# Patient Record
Sex: Male | Born: 1950 | Race: White | Hispanic: No | Marital: Single | Smoking: Current some day smoker
Health system: Southern US, Community
[De-identification: ages and names within clinical notes are randomized; demographics above are authoritative.]

## PROBLEM LIST (undated history)

## (undated) DIAGNOSIS — K219 Gastro-esophageal reflux disease without esophagitis: Secondary | ICD-10-CM

## (undated) DIAGNOSIS — G9340 Encephalopathy, unspecified: Secondary | ICD-10-CM

## (undated) DIAGNOSIS — F1010 Alcohol abuse, uncomplicated: Secondary | ICD-10-CM

## (undated) DIAGNOSIS — I4891 Unspecified atrial fibrillation: Secondary | ICD-10-CM

## (undated) DIAGNOSIS — J449 Chronic obstructive pulmonary disease, unspecified: Secondary | ICD-10-CM

## (undated) DIAGNOSIS — Z720 Tobacco use: Secondary | ICD-10-CM

## (undated) DIAGNOSIS — J9600 Acute respiratory failure, unspecified whether with hypoxia or hypercapnia: Secondary | ICD-10-CM

## (undated) DIAGNOSIS — E785 Hyperlipidemia, unspecified: Secondary | ICD-10-CM

## (undated) DIAGNOSIS — I10 Essential (primary) hypertension: Secondary | ICD-10-CM

## (undated) HISTORY — DX: Unspecified atrial fibrillation: I48.91

## (undated) HISTORY — DX: Gastro-esophageal reflux disease without esophagitis: K21.9

## (undated) HISTORY — DX: Tobacco use: Z72.0

## (undated) HISTORY — DX: Alcohol abuse, uncomplicated: F10.10

## (undated) HISTORY — DX: Encephalopathy, unspecified: G93.40

## (undated) HISTORY — DX: Hyperlipidemia, unspecified: E78.5

## (undated) HISTORY — DX: Chronic obstructive pulmonary disease, unspecified: J44.9

## (undated) HISTORY — DX: Acute respiratory failure, unspecified whether with hypoxia or hypercapnia: J96.00

## (undated) HISTORY — DX: Essential (primary) hypertension: I10

## (undated) HISTORY — PX: CHOLECYSTECTOMY: SHX55

---

## 2005-06-21 ENCOUNTER — Emergency Department: Payer: Self-pay | Admitting: Emergency Medicine

## 2006-02-19 ENCOUNTER — Emergency Department: Payer: Self-pay | Admitting: Emergency Medicine

## 2009-05-29 ENCOUNTER — Ambulatory Visit: Payer: Self-pay | Admitting: Nurse Practitioner

## 2009-12-09 ENCOUNTER — Ambulatory Visit: Payer: Self-pay | Admitting: Oncology

## 2009-12-27 ENCOUNTER — Ambulatory Visit: Payer: Self-pay | Admitting: Oncology

## 2010-01-09 ENCOUNTER — Ambulatory Visit: Payer: Self-pay | Admitting: Oncology

## 2010-02-06 ENCOUNTER — Ambulatory Visit: Payer: Self-pay | Admitting: Oncology

## 2010-05-31 ENCOUNTER — Emergency Department: Payer: Self-pay | Admitting: Emergency Medicine

## 2011-01-30 ENCOUNTER — Ambulatory Visit: Payer: Self-pay

## 2011-11-29 ENCOUNTER — Inpatient Hospital Stay (HOSPITAL_COMMUNITY)
Admission: AD | Admit: 2011-11-29 | Discharge: 2011-12-02 | DRG: 208 | Disposition: A | Discharge status: Home / Self Care | Payer: Medicaid Other | Source: Other Acute Inpatient Hospital | Attending: Pulmonary Disease | Admitting: Pulmonary Disease

## 2011-11-29 ENCOUNTER — Inpatient Hospital Stay: Payer: Self-pay | Admitting: Internal Medicine

## 2011-11-29 DIAGNOSIS — J189 Pneumonia, unspecified organism: Secondary | ICD-10-CM | POA: Diagnosis present

## 2011-11-29 DIAGNOSIS — F17200 Nicotine dependence, unspecified, uncomplicated: Secondary | ICD-10-CM | POA: Diagnosis present

## 2011-11-29 DIAGNOSIS — G9340 Encephalopathy, unspecified: Secondary | ICD-10-CM

## 2011-11-29 DIAGNOSIS — J441 Chronic obstructive pulmonary disease with (acute) exacerbation: Secondary | ICD-10-CM | POA: Diagnosis present

## 2011-11-29 DIAGNOSIS — J9620 Acute and chronic respiratory failure, unspecified whether with hypoxia or hypercapnia: Principal | ICD-10-CM | POA: Diagnosis present

## 2011-11-29 DIAGNOSIS — G9341 Metabolic encephalopathy: Secondary | ICD-10-CM | POA: Diagnosis present

## 2011-11-29 DIAGNOSIS — J9600 Acute respiratory failure, unspecified whether with hypoxia or hypercapnia: Secondary | ICD-10-CM

## 2011-11-29 DIAGNOSIS — J159 Unspecified bacterial pneumonia: Secondary | ICD-10-CM

## 2011-11-29 LAB — COMPREHENSIVE METABOLIC PANEL
Albumin: 2.9 g/dL — ABNORMAL LOW (ref 3.5–5.2)
BUN: 15 mg/dL (ref 6–23)
Calcium: 8.3 mg/dL — ABNORMAL LOW (ref 8.4–10.5)
Creatinine, Ser: 0.61 mg/dL (ref 0.50–1.35)
Total Bilirubin: 0.3 mg/dL (ref 0.3–1.2)
Total Protein: 5.7 g/dL — ABNORMAL LOW (ref 6.0–8.3)

## 2011-11-29 LAB — MAGNESIUM: Magnesium: 1.8 mg/dL (ref 1.5–2.5)

## 2011-11-29 LAB — PHOSPHORUS: Phosphorus: 2.9 mg/dL (ref 2.3–4.6)

## 2011-11-29 LAB — POCT I-STAT 3, ART BLOOD GAS (G3+)
pCO2 arterial: 42.9 mmHg (ref 35.0–45.0)
pO2, Arterial: 129 mmHg — ABNORMAL HIGH (ref 80.0–100.0)

## 2011-11-29 LAB — URINE MICROSCOPIC-ADD ON

## 2011-11-29 LAB — STREP PNEUMONIAE URINARY ANTIGEN: Strep Pneumo Urinary Antigen: NEGATIVE

## 2011-11-29 LAB — GLUCOSE, CAPILLARY: Glucose-Capillary: 96 mg/dL (ref 70–99)

## 2011-11-29 LAB — PROTIME-INR: INR: 1.1 (ref 0.00–1.49)

## 2011-11-29 LAB — URINALYSIS, ROUTINE W REFLEX MICROSCOPIC
Glucose, UA: NEGATIVE mg/dL
Protein, ur: 30 mg/dL — AB

## 2011-11-29 LAB — MRSA PCR SCREENING: MRSA by PCR: NEGATIVE

## 2011-11-29 LAB — APTT: aPTT: 51 seconds — ABNORMAL HIGH (ref 24–37)

## 2011-11-29 LAB — PRO B NATRIURETIC PEPTIDE: Pro B Natriuretic peptide (BNP): 194.5 pg/mL — ABNORMAL HIGH (ref 0–125)

## 2011-11-29 MED ORDER — BIOTENE DRY MOUTH MT LIQD
1.0000 "application " | Freq: Four times a day (QID) | OROMUCOSAL | Status: DC
  Administered 2011-11-30 (×3): 15 mL via OROMUCOSAL

## 2011-11-29 MED ORDER — SODIUM CHLORIDE 0.9 % IV SOLN
INTRAVENOUS | Status: DC
  Administered 2011-11-29 – 2011-11-30 (×3): via INTRAVENOUS

## 2011-11-29 MED ORDER — FENTANYL CITRATE 0.05 MG/ML IJ SOLN
50.0000 ug | INTRAMUSCULAR | Status: DC | PRN
  Administered 2011-11-29: 100 ug via INTRAVENOUS
  Administered 2011-11-30 (×4): 50 ug via INTRAVENOUS
  Filled 2011-11-29 (×5): qty 2

## 2011-11-29 MED ORDER — IPRATROPIUM BROMIDE 0.02 % IN SOLN
0.5000 mg | Freq: Four times a day (QID) | RESPIRATORY_TRACT | Status: DC
  Administered 2011-11-29 – 2011-11-30 (×2): 0.5 mg via RESPIRATORY_TRACT
  Filled 2011-11-29 (×2): qty 2.5

## 2011-11-29 MED ORDER — DEXTROSE 5 % IV SOLN
2.0000 g | INTRAVENOUS | Status: DC
  Administered 2011-11-29 – 2011-11-30 (×2): 2 g via INTRAVENOUS
  Filled 2011-11-29 (×3): qty 2

## 2011-11-29 MED ORDER — METHYLPREDNISOLONE SODIUM SUCC 125 MG IJ SOLR: 80.0000 mg | Freq: Two times a day (BID) | INTRAMUSCULAR | Status: DC

## 2011-11-29 MED ORDER — MIDAZOLAM HCL 2 MG/2ML IJ SOLN: 2.0000 mg | INTRAMUSCULAR | Status: DC | PRN

## 2011-11-29 MED ORDER — DEXTROSE 5 % IV SOLN
1.0000 g | INTRAVENOUS | Status: DC
  Filled 2011-11-29: qty 10

## 2011-11-29 MED ORDER — SODIUM CHLORIDE 0.9 % IJ SOLN
INTRAMUSCULAR | Status: AC
  Administered 2011-11-29: 10 mL
  Filled 2011-11-29: qty 40

## 2011-11-29 MED ORDER — MOXIFLOXACIN HCL IN NACL 400 MG/250ML IV SOLN
400.0000 mg | INTRAVENOUS | Status: DC
  Administered 2011-11-29 – 2011-11-30 (×2): 400 mg via INTRAVENOUS
  Filled 2011-11-29 (×3): qty 250

## 2011-11-29 MED ORDER — IPRATROPIUM-ALBUTEROL 18-103 MCG/ACT IN AERO: 6.0000 | INHALATION_SPRAY | RESPIRATORY_TRACT | Status: DC | PRN

## 2011-11-29 MED ORDER — HEPARIN SODIUM (PORCINE) 5000 UNIT/ML IJ SOLN
5000.0000 [IU] | Freq: Three times a day (TID) | INTRAMUSCULAR | Status: DC
  Administered 2011-11-29 – 2011-12-01 (×7): 5000 [IU] via SUBCUTANEOUS
  Filled 2011-11-29 (×11): qty 1

## 2011-11-29 MED ORDER — ALBUTEROL SULFATE (5 MG/ML) 0.5% IN NEBU
2.5000 mg | INHALATION_SOLUTION | RESPIRATORY_TRACT | Status: DC
  Administered 2011-11-29 – 2011-11-30 (×4): 2.5 mg via RESPIRATORY_TRACT
  Filled 2011-11-29 (×4): qty 0.5

## 2011-11-29 MED ORDER — PANTOPRAZOLE SODIUM 40 MG IV SOLR
40.0000 mg | Freq: Every day | INTRAVENOUS | Status: DC
  Administered 2011-11-29 – 2011-11-30 (×2): 40 mg via INTRAVENOUS
  Filled 2011-11-29 (×3): qty 40

## 2011-11-29 MED ORDER — METHYLPREDNISOLONE SODIUM SUCC 125 MG IJ SOLR
60.0000 mg | Freq: Four times a day (QID) | INTRAMUSCULAR | Status: DC
  Administered 2011-11-29: 60 mg via INTRAVENOUS
  Filled 2011-11-29 (×6): qty 0.96

## 2011-11-29 MED ORDER — CHLORHEXIDINE GLUCONATE 0.12 % MT SOLN
15.0000 mL | Freq: Two times a day (BID) | OROMUCOSAL | Status: DC
  Administered 2011-11-29 – 2011-11-30 (×4): 15 mL via OROMUCOSAL
  Filled 2011-11-29 (×3): qty 15

## 2011-11-29 MED ORDER — SODIUM CHLORIDE 0.9 % IV SOLN: 250.0000 mL | INTRAVENOUS | Status: DC | PRN

## 2011-11-29 MED ORDER — IPRATROPIUM-ALBUTEROL 18-103 MCG/ACT IN AERO: 6.0000 | INHALATION_SPRAY | RESPIRATORY_TRACT | Status: DC

## 2011-11-29 NOTE — H&P (Signed)
Name: Shaun Mitchell MRN: [id number] DOB: July 29, 1951    LOS: 0  PCCM ADMISSION NOTE  History of Present Illness: 60 yo WM with h/o COPD who presented to [place] [facility] on 12/21 with altered mental status and respiratory distress.  He reported cough productive of yellow sputum.  Intubated for respiratory failure.  Lines / Drains: 12/21  ETT 12/21  OGT 12/21  Foley  Cultures: 12/21  UC 12/21  BC  Antibiotics: 12/21  Ceftriaxone 12/21  Avelox  Tests / Events: 12/21  Intubated for respiratory failure  The patient is sedated, intubated and unable to provide history, which was obtained for available medical records.    No past medical history on file. No past surgical history on file. Prior to Admission medications   Not on File   Allergies Allergies not on file  Family History No family history on file.  Social History  does not have a smoking history on file. He does not have any smokeless tobacco history on file. His alcohol and drug histories not on file.  Review Of Systems  11 points review of systems is negative with an exception of listed in HPI.  Vital Signs: Pulse Rate:  [99] 99  (12/21 1700) Resp:  [17] 17  (12/21 1700) BP: (98)/(69) 98/69 mmHg (12/21 1700) SpO2:  [96 %] 96 % (12/21 1700) FiO2 (%):  [40 %] 40 % (12/21 1700) Weight:  [64 kg (141 lb 1.5 oz)] 141 lb 1.5 oz (64 kg) (12/21 1700)    Physical Examination: General:  Mechanically ventilated, synchronnous Neuro:  Sedated, no distress   HEENT:  ETT, NGT Neck:  No JVD   Cardiovascular:  RRR, no M/R/G Lungs:  Diminished bilateral air entry, few exp wheezes Abdomen:  Soft, nontender, bowel sounds present Musculoskeletal:  No edema Skin:  Intact  Ventilator settings: Vent Mode:  [-] PRVC FiO2 (%):  [40 %] 40 % Set Rate:  [16 bmp] 16 bmp Vt Set:  [500 mL] 500 mL PEEP:  [5.4 cmH20] 5.4 cmH20 Plateau Pressure:  [18 cmH20] 18 cmH20  Labs and Imaging:  Reviewed.  Please refer to the  Assessment and Plan section for relevant results.  Assessment and Plan:  COPD, acute exacerbation -->bronchodilators -->Solu-Medrol 60 mg IV q6h  Suspected community acquired pneumonia -->flu screen -->blood / sputum Cx -->Strep/Legion U Ag -->PCT -->ceftriaxone / Avelox  Acute respiratory failure -->full mechanical support -->goal pH > 7.30, goal SpO2 > 92 -->f/u ABG -->f/u CXR -->daily SBT  Metabolic encphalopathy -->Fentanyl/Versed for ventilator synchrony -->daily WUA  Best practices / Disposition -->ICU status under PCCM -->full code -->Heparin for DVT Px -->Protonix for GI Px -->ventilator bundle -->NPO -->family is not available for update  The patient is critically ill with multiple organ systems failure and requires high complexity decision making for assessment and support, frequent evaluation and titration of therapies, application of advanced monitoring technologies and extensive interpretation of multiple databases. Critical Care Time devoted to patient care services described in this note is 35 minutes.  Orlean Bradford, M.D. Pulmonary and Critical Care Medicine [facility] Cell: [phone] Pager: [phone]  11/29/2011, 5:34 PM

## 2011-11-29 NOTE — Progress Notes (Signed)
eLink Physician-Brief Progress Note Patient Name: Shaun Mitchell DOB: Sep 17, 1951 MRN: [id number]  Date of Service  11/29/2011   HPI/Events of Note   Pt admitted in tfr from [place] for Copd exacerbation and VDRF  eICU Interventions  See admission orders, full note to follow   Intervention Category Major Interventions: Respiratory failure - evaluation and management  [name] 11/29/2011, 5:33 PM

## 2011-11-30 ENCOUNTER — Inpatient Hospital Stay (HOSPITAL_COMMUNITY): Payer: Medicaid Other

## 2011-11-30 LAB — CARDIAC PANEL(CRET KIN+CKTOT+MB+TROPI)
CK, MB: 3.5 ng/mL (ref 0.3–4.0)
CK, MB: 3.6 ng/mL (ref 0.3–4.0)
CK, MB: 4.7 ng/mL — ABNORMAL HIGH (ref 0.3–4.0)
Total CK: 57 U/L (ref 7–232)
Troponin I: <0.3 ng/mL (ref <0.30)
Troponin I: <0.3 ng/mL (ref <0.30)
Troponin I: <0.3 ng/mL (ref <0.30)

## 2011-11-30 LAB — POCT I-STAT 3, ART BLOOD GAS (G3+)
O2 Saturation: 97 %
Patient temperature: 98.6
TCO2: 28 mmol/L (ref 0–100)
pCO2 arterial: 47.2 mmHg — ABNORMAL HIGH (ref 35.0–45.0)
pO2, Arterial: 93 mmHg (ref 80.0–100.0)

## 2011-11-30 LAB — INFLUENZA PANEL BY PCR (TYPE A & B): H1N1 flu by pcr: NOT DETECTED

## 2011-11-30 LAB — MAGNESIUM: Magnesium: 2 mg/dL (ref 1.5–2.5)

## 2011-11-30 LAB — PHOSPHORUS: Phosphorus: 1.7 mg/dL — ABNORMAL LOW (ref 2.3–4.6)

## 2011-11-30 MED ORDER — SODIUM CHLORIDE 0.9 % IJ SOLN
INTRAMUSCULAR | Status: AC
  Administered 2011-11-30: 10 mL
  Filled 2011-11-30: qty 20

## 2011-11-30 MED ORDER — IPRATROPIUM BROMIDE 0.02 % IN SOLN: 0.5000 mg | Freq: Four times a day (QID) | RESPIRATORY_TRACT | Status: DC

## 2011-11-30 MED ORDER — IPRATROPIUM BROMIDE 0.02 % IN SOLN
0.5000 mg | Freq: Four times a day (QID) | RESPIRATORY_TRACT | Status: DC
  Administered 2011-11-30 – 2011-12-01 (×4): 0.5 mg via RESPIRATORY_TRACT
  Filled 2011-11-30 (×4): qty 2.5

## 2011-11-30 MED ORDER — ALBUTEROL SULFATE (5 MG/ML) 0.5% IN NEBU
2.5000 mg | INHALATION_SOLUTION | Freq: Four times a day (QID) | RESPIRATORY_TRACT | Status: DC
  Administered 2011-11-30 – 2011-12-01 (×4): 2.5 mg via RESPIRATORY_TRACT
  Filled 2011-11-30 (×4): qty 0.5

## 2011-11-30 MED ORDER — METOPROLOL TARTRATE 50 MG PO TABS
50.0000 mg | ORAL_TABLET | Freq: Two times a day (BID) | ORAL | Status: DC
  Administered 2011-11-30 – 2011-12-02 (×4): 50 mg via ORAL
  Filled 2011-11-30 (×6): qty 1

## 2011-11-30 MED ORDER — METHYLPREDNISOLONE SODIUM SUCC 40 MG IJ SOLR
40.0000 mg | Freq: Three times a day (TID) | INTRAMUSCULAR | Status: DC
  Administered 2011-11-30 – 2011-12-01 (×3): 40 mg via INTRAVENOUS
  Filled 2011-11-30 (×6): qty 1

## 2011-11-30 NOTE — Progress Notes (Signed)
CRITICAL VALUE ALERT  Critical value received:  CXRAY shows questionable right-sided pneumonia per Radiologist.  Date of notification:  11/30/11  Time of notification:  00:48  Critical value read back:yes  Nurse who received alert:  Levander Campion RN BSN  MD notified (1st page):  Ilda Basset, MD  Time of first page:  00:48  MD notified (2nd page):  Time of second page:  Responding MD:  Ilda Basset, MD  Time MD responded:  00:59

## 2011-11-30 NOTE — Procedures (Signed)
Extubation Procedure Note  Patient Details:   Name: Shaun Mitchell DOB: 10/19/1951 MRN: [id number]   Airway Documentation:  Airway 7.5 mm (Active)  Secured at (cm) 23 cm 11/30/2011  9:32 AM  Measured From Lips 11/30/2011  9:32 AM  Secured Location Right 11/30/2011  9:32 AM  Secured By [employer] 11/30/2011  9:32 AM  Tube Holder Repositioned Yes 11/30/2011  8:18 AM    Evaluation  O2 sats: stable throughout Complications: No apparent complications Patient did tolerate procedure well. Bilateral Breath Sounds: Diminished Suctioning: Airway Yes  Joylene John 11/30/2011, 10:59 AM

## 2011-11-30 NOTE — Progress Notes (Signed)
Name: Shaun Mitchell MRN: [id number] DOB: [birth year]  DOS: 11/30/2011    LOS: 1  CRITICAL CARE PROGRESS NOTE  Patient description: 60 yo WM with h/o COPD who presented to [place] [facility] on 12/21 with altered mental status and respiratory distress. He reported cough productive of yellow sputum. Intubated for respiratory failure.  Lines / Drains:  12/21 ETT >>>12/22 12/21 OGT >>>12/22 12/21 Foley >>>12/22  Cultures:  12/21 UC >>> 12/21 BC>>>  12/21 urine strep>> NEG  Antibiotics:  12/21 Ceftriaxone >>> 12/21 Avelox >>>  Tests / Events:  12/21 Intubated for respiratory failure  Overnight: Much improved.  Awake and alert this am, comfortable on PS 5/5.  Extubated w/o difficulty.  Intake/Output: 12/21 0701 - 12/22 0700 In: 1310 [I.V.:1010; IV Piggyback:300] Out: 625 [Urine:625]   Ventilator  Settings: Vent Mode:  [-] CPAP;PSV FiO2 (%):  [39.7 %-41.9 %] 39.7 % Set Rate:  [16 bmp] 16 bmp Vt Set:  [500 mL] 500 mL PEEP:  [5 cmH20-5.7 cmH20] 5 cmH20 Pressure Support:  [5 cmH20] 5 cmH20 Plateau Pressure:  [16 cmH20-24 cmH20] 16 cmH20  Vital Signs: Temp:  [98.6 F (37 C)-99.2 F (37.3 C)] 98.9 F (37.2 C) (12/22 0802) Pulse Rate:  [83-107] 106  (12/22 0900) Resp:  [14-22] 19  (12/22 0900) BP: (79-133)/(57-77) 116/68 mmHg (12/22 0900) SpO2:  [96 %-100 %] 99 % (12/22 0900) FiO2 (%):  [39.7 %-41.9 %] 39.7 % (12/22 0900) Weight:  [141 lb 1.5 oz (64 kg)-145 lb 15.1 oz (66.2 kg)] 145 lb 15.1 oz (66.2 kg) (12/22 0500)  Physical Examination: General: wdwn male, NAD  Neuro: awake and alert, appropriate, follows all commands CV: s1s2 rrr, no m/r/g PULM: resps even non labored on PS 5/5, Vt =550, slight diminished L otherwise CTA GI: soft, +bs Extremities: warm and dry, no edema     Basic Metabolic Panel:  Lab 11/30/11 4098 11/29/11 1800  NA -- 132*  K -- 3.7  CL -- 96  CO2 -- 27  GLUCOSE -- 96  BUN -- 15  CREATININE -- 0.61  CALCIUM -- 8.3*  MG 2.0 1.8  PHOS  1.7* 2.9   Liver Function Tests:  Lab 11/29/11 1800  AST 108*  ALT 138*  ALKPHOS 44  BILITOT 0.3  PROT 5.7*  ALBUMIN 2.9*    Cardiac Enzymes:  Lab 11/30/11 0057  CKTOTAL 57  CKMB 3.5  CKMBINDEX --  TROPONINI <0.30     Imaging: Dg Chest Port 1 View  11/30/2011  *RADIOLOGY REPORT*  Clinical Data: Endotracheal tube placement; respiratory failure.  PORTABLE CHEST - 1 VIEW  Comparison: None.  Findings: The patient's endotracheal tube is seen ending 4 cm above the carina.  An enteric tube is noted extending below the diaphragm.  The lungs are well-aerated.  Mild patchy right-sided airspace opacity raises question for mild pneumonia.  There is no evidence of pleural effusion or pneumothorax.  The cardiomediastinal silhouette is within normal limits.  No acute osseous abnormalities are seen.  IMPRESSION:  1.  Endotracheal tube seen ending 4 cm above the carina. 2.  Mild patchy right-sided airspace opacity raises question for mild pneumonia.  Original Report Authenticated By: Tonia Ghent, M.D.   Assessment/ Plan  1. Acute on chronic resp failure r/t AECOPD +/- CAP.  Looks great on PS wean.  PLAN -  -extubate  bronchodilators  Wean solumedrol - 40 q 8 F/u CXR pulm hygiene  2. Suspected community acquired pneumonia  -->flu screen pending -->blood / sputum Cx  -->Strep/Legion  U Ag  -->PCT  -->ceftriaxone / Avelox   3. Metabolic encphalopathy -- Resolved.      Danford Bad, NP 11/30/2011  10:26 AM  I have reviewed above, examined pt, and agree with assessment/plan.  Coralyn Helling, MD 11/30/2011, 12:35 PM Pager:  [phone]

## 2011-12-01 LAB — LEGIONELLA ANTIGEN, URINE

## 2011-12-01 MED ORDER — MOXIFLOXACIN HCL 400 MG PO TABS
400.0000 mg | ORAL_TABLET | Freq: Every day | ORAL | Status: DC
  Administered 2011-12-01: 400 mg via ORAL
  Filled 2011-12-01 (×2): qty 1

## 2011-12-01 MED ORDER — PANTOPRAZOLE SODIUM 40 MG PO TBEC
40.0000 mg | DELAYED_RELEASE_TABLET | Freq: Every day | ORAL | Status: DC
  Administered 2011-12-01 – 2011-12-02 (×2): 40 mg via ORAL
  Filled 2011-12-01 (×2): qty 1

## 2011-12-01 MED ORDER — BIOTENE DRY MOUTH MT LIQD
15.0000 mL | Freq: Two times a day (BID) | OROMUCOSAL | Status: DC
  Administered 2011-12-01: 15 mL via OROMUCOSAL

## 2011-12-01 MED ORDER — IPRATROPIUM BROMIDE 0.02 % IN SOLN
0.5000 mg | Freq: Three times a day (TID) | RESPIRATORY_TRACT | Status: DC
  Administered 2011-12-01 – 2011-12-02 (×3): 0.5 mg via RESPIRATORY_TRACT
  Filled 2011-12-01 (×3): qty 2.5

## 2011-12-01 MED ORDER — ALBUTEROL SULFATE (5 MG/ML) 0.5% IN NEBU
2.5000 mg | INHALATION_SOLUTION | Freq: Three times a day (TID) | RESPIRATORY_TRACT | Status: DC
  Administered 2011-12-01 – 2011-12-02 (×3): 2.5 mg via RESPIRATORY_TRACT
  Filled 2011-12-01 (×3): qty 0.5

## 2011-12-01 NOTE — Progress Notes (Signed)
Name: Shaun Mitchell MRN: [id number] DOB: [birth year]  DOS: 12/01/2011    LOS: 2  CRITICAL CARE PROGRESS NOTE  Patient description: 60 yo WM with h/o COPD who presented to [place] [facility] on 12/21 with altered mental status and respiratory distress. He reported cough productive of yellow sputum. Intubated for respiratory failure.  Lines / Drains:  12/21 ETT >>>12/22 12/21 OGT >>>12/22 12/21 Foley >>>12/22  Cultures:  12/21 UC >>> 12/21 BC>>>  12/21 urine strep>> NEG 12/22 sputum>>> rare yeast>>> 12/21 flu>>>NEG  Antibiotics:  12/21 Ceftriaxone >>>12/23 12/21 Avelox >>>  Tests / Events:  12/21 Intubated for respiratory failure  Overnight: Much improved.  Extubated 12/22.  Feels good.  Has not been OOB. Wants to walk.   Intake/Output: 12/22 0701 - 12/23 0700 In: 1305 [P.O.:480; I.V.:525; IV Piggyback:300] Out: 2100 [Urine:2100]   Vital Signs: Temp:  [97.9 F (36.6 C)-99.3 F (37.4 C)] 97.9 F (36.6 C) (12/23 0800) Pulse Rate:  [76-140] 76  (12/23 0700) Resp:  [19-27] 20  (12/23 0700) BP: (112-173)/(68-94) 122/69 mmHg (12/23 0700) SpO2:  [92 %-100 %] 97 % (12/23 0700) FiO2 (%):  [39.7 %-98 %] 98 % (12/23 0146) Weight:  [142 lb 3.2 oz (64.5 kg)] 142 lb 3.2 oz (64.5 kg) (12/23 0500)  Physical Examination: General: wdwn male, NAD  Neuro: awake and alert, appropriate, MAE CV: s1s2 rrr, no m/r/g PULM: resps even non labored on [place], slight diminished bases, no audible wheeze  GI: soft, +bs Extremities: warm and dry, no edema     Basic Metabolic Panel:  Lab 11/30/11 4098 11/29/11 1800  NA -- 132*  K -- 3.7  CL -- 96  CO2 -- 27  GLUCOSE -- 96  BUN -- 15  CREATININE -- 0.61  CALCIUM -- 8.3*  MG 2.0 1.8  PHOS 1.7* 2.9   Liver Function Tests:  Lab 11/29/11 1800  AST 108*  ALT 138*  ALKPHOS 44  BILITOT 0.3  PROT 5.7*  ALBUMIN 2.9*    Cardiac Enzymes:  Lab 11/30/11 1630 11/30/11 0959 11/30/11 0057  CKTOTAL 73 51 57  CKMB 4.7* 3.6 3.5  CKMBINDEX  -- -- --  TROPONINI <0.30 <0.30 <0.30    Imaging: Dg Chest Port 1 View  11/30/2011  *RADIOLOGY REPORT*  Clinical Data: Endotracheal tube placement; respiratory failure.  PORTABLE CHEST - 1 VIEW  Comparison: None.  Findings: The patient's endotracheal tube is seen ending 4 cm above the carina.  An enteric tube is noted extending below the diaphragm.  The lungs are well-aerated.  Mild patchy right-sided airspace opacity raises question for mild pneumonia.  There is no evidence of pleural effusion or pneumothorax.  The cardiomediastinal silhouette is within normal limits.  No acute osseous abnormalities are seen.  IMPRESSION:  1.  Endotracheal tube seen ending 4 cm above the carina. 2.  Mild patchy right-sided airspace opacity raises question for mild pneumonia.  Original Report Authenticated By: Tonia Ghent, M.D.   Assessment/ Plan  1. Acute on chronic resp failure r/t AECOPD +/- CAP.  Much improved. Extubated 12/22.  PLAN -  Cont BD Change steroids, abx to PO  pulm hygiene Mobilize Needs outpt pulm f/u -- ?? See Dr. Kendrick Fries in [place], pt thinks he has pulm referral scheduled  Anticipate d/c home in am   2. Suspected community acquired pneumonia  -->flu screen neg -->follow blood / sputum Cx  -->Strep/Legion U Ag  -->PCT neg -->narrow abx to PO avelox   3. Metabolic encephalopathy -- Resolved.  Danford Bad, NP 12/01/2011  8:10 AM  Reviewed above, examined patient, and agree with assessment/plan.    Nayeliz Hipp, MD 12/01/2011, 10:20 AM Pager:  [phone]

## 2011-12-02 DIAGNOSIS — J441 Chronic obstructive pulmonary disease with (acute) exacerbation: Secondary | ICD-10-CM | POA: Diagnosis present

## 2011-12-02 DIAGNOSIS — G9341 Metabolic encephalopathy: Secondary | ICD-10-CM | POA: Diagnosis present

## 2011-12-02 DIAGNOSIS — J189 Pneumonia, unspecified organism: Secondary | ICD-10-CM | POA: Diagnosis present

## 2011-12-02 DIAGNOSIS — J9620 Acute and chronic respiratory failure, unspecified whether with hypoxia or hypercapnia: Secondary | ICD-10-CM | POA: Diagnosis present

## 2011-12-02 LAB — CULTURE, RESPIRATORY W GRAM STAIN

## 2011-12-02 MED ORDER — IPRATROPIUM-ALBUTEROL 18-103 MCG/ACT IN AERO: 2.0000 | INHALATION_SPRAY | Freq: Four times a day (QID) | RESPIRATORY_TRACT | Status: DC

## 2011-12-02 MED ORDER — PREDNISONE 20 MG PO TABS
40.0000 mg | ORAL_TABLET | Freq: Every day | ORAL | Status: DC
  Administered 2011-12-02: 40 mg via ORAL
  Filled 2011-12-02 (×3): qty 2

## 2011-12-02 MED ORDER — GUAIFENESIN ER 600 MG PO TB12: 1200.0000 mg | ORAL_TABLET | Freq: Two times a day (BID) | ORAL | Status: AC

## 2011-12-02 MED ORDER — MOXIFLOXACIN HCL 400 MG PO TABS: 400.0000 mg | ORAL_TABLET | Freq: Every day | ORAL | Status: AC

## 2011-12-02 MED ORDER — ALBUTEROL SULFATE (5 MG/ML) 0.5% IN NEBU: 2.5000 mg | INHALATION_SOLUTION | Freq: Three times a day (TID) | RESPIRATORY_TRACT | Status: AC

## 2011-12-02 MED ORDER — IPRATROPIUM-ALBUTEROL 18-103 MCG/ACT IN AERO
2.0000 | INHALATION_SPRAY | Freq: Four times a day (QID) | RESPIRATORY_TRACT | Status: DC
  Administered 2011-12-02 (×2): 2 via RESPIRATORY_TRACT
  Filled 2011-12-02: qty 14.7

## 2011-12-02 MED ORDER — GUAIFENESIN ER 600 MG PO TB12
1200.0000 mg | ORAL_TABLET | Freq: Two times a day (BID) | ORAL | Status: DC
  Administered 2011-12-02: 1200 mg via ORAL
  Filled 2011-12-02 (×2): qty 2

## 2011-12-02 MED ORDER — PREDNISONE 10 MG PO TABS: ORAL_TABLET | ORAL | Status: DC

## 2011-12-02 MED ORDER — METOPROLOL TARTRATE 50 MG PO TABS: 50.0000 mg | ORAL_TABLET | Freq: Two times a day (BID) | ORAL | Status: DC

## 2011-12-02 MED ORDER — BUDESONIDE 0.25 MG/2ML IN SUSP: 0.2500 mg | Freq: Two times a day (BID) | RESPIRATORY_TRACT | Status: DC

## 2011-12-02 MED ORDER — IPRATROPIUM BROMIDE 0.02 % IN SOLN: 0.5000 mg | Freq: Three times a day (TID) | RESPIRATORY_TRACT | Status: DC

## 2011-12-02 NOTE — Discharge Summary (Signed)
Physician Discharge Summary  Patient ID: Shaun Mitchell MRN: [id number] DOB/AGE: January 22, 1951 60 y.o.  Admit date: 11/29/2011 Discharge date: 12/02/2011    Discharge Diagnoses:  Active Problems:  Acute-on-chronic respiratory failure  COPD with acute exacerbation  CAP (community acquired pneumonia)  Metabolic encephalopathy    Brief Summary: Shaun Mitchell is a 60 y.o. y/o male with a PMH of COPD who presented on December 21 [facility] with altered mental status and acute respiratory distress. Per [place] reports he had cough productive of yellow sputum and increased shortness of breath for a short time prior to admission. He was intubated for respiratory failure and transferred from  to [facility] on December 21.  Hospital Course by Discharge Summary  Acute-on-chronic respiratory failure -- multifactorial in the setting of acute exacerbation of COPD and community-acquired pneumonia. Patient required intubation and mechanical ventilation from 12/21 through 12/22. He was treated with IV antibiotics, nebulized bronchodilators, IV steroids. He improved quickly and was successfully extubated on 12/22. Antibiotics and steroids were transitioned to by mouth. At the time of discharge patient's respiratory status is back to his baseline. He is back to his baseline O2 requirement. He'll be discharged with continued prednisone taper, nebulized bronchodilators, home O2, by mouth antibiotics. He will followup with Dr. Welton Flakes, pulmonary in [place].   COPD with acute exacerbation -- please see previous problem. 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 Patient already has home O2. He'll be discharged with scheduled nebulized bronchodilators. Case management is arranging a nebulizer machine for home. He'll followup with the pulmonary in [place] and likely needs baseline PFTs.   CAP (community acquired pneumonia)--cultures have been negative to date.  Patient was negative for flu. He was initially treated with IV Rocephin and Avelox. As he rapidly improved and cultures were negative he was transitioned to by mouth Avelox alone and he'll be discharged on this for a total of 10 days. Infiltrate is resolved on chest x-ray   Metabolic encephalopathy -- in the setting of respiratory failure and likely hypercarbia.  This quickly resolved after intubation and patient has had no further delirium  Lines / Drains:  12/21 ETT >>>12/22  12/21 OGT >>>12/22  12/21 Foley >>>12/22   Cultures:  12/21 UC >>> ?? collected 12/21 BC>>> ngtd>>> 12/21 urine strep>> NEG  12/22 sputum>>> rare yeast>>>  12/21 flu>>>NEG   Antibiotics:  12/21 Ceftriaxone >>>12/23  12/21 Avelox >>>     Discharge Exam: General: wdwn male, NAD  Neuro: awake and alert, appropriate, MAE  CV: s1s2 rrr, no m/r/g  PULM: resps even non labored on [place], slight diminished bases, no audible wheeze  GI: soft, +bs  Extremities: warm and dry, no edema    Discharge Labs BMET    Component Value Date/Time   NA 132* 11/29/2011 1800   K 3.7 11/29/2011 1800   CL 96 11/29/2011 1800   CO2 27 11/29/2011 1800   GLUCOSE 96 11/29/2011 1800   BUN 15 11/29/2011 1800   CREATININE 0.61 11/29/2011 1800   CALCIUM 8.3* 11/29/2011 1800   GFRNONAA >90 11/29/2011 1800   GFRAA >90 11/29/2011 1800         Grigor, [place]  Home Medication Instructions [id number]   Printed on:12/02/11 0918  Medication Information                    albuterol (PROVENTIL HFA;VENTOLIN HFA) 108 (90 BASE) MCG/ACT inhaler Inhale 2 puffs into the lungs every 6 (six) hours  as needed. For shortness of breath            metoprolol (LOPRESSOR) 50 MG tablet Take 1 tablet (50 mg total) by mouth every 12 (twelve) hours.           moxifloxacin (AVELOX) 400 MG tablet Take 1 tablet (400 mg total) by mouth daily at 6 PM.           predniSONE (DELTASONE) 10 MG tablet 4 tabs PO daily x 3 days then 3 tabs PO daily x 3  days then 2 tabs PO daily x 3 days then 1 tab PO daily x 3 days then STOP            albuterol (PROVENTIL) (5 MG/ML) 0.5% nebulizer solution Take 0.5 mLs (2.5 mg total) by nebulization 3 (three) times daily.           ipratropium (ATROVENT) 0.02 % nebulizer solution Take 2.5 mLs (0.5 mg total) by nebulization 3 (three) times daily.           guaiFENesin (MUCINEX) 600 MG 12 hr tablet Take 2 tablets (1,200 mg total) by mouth 2 (two) times daily.           budesonide (PULMICORT) 0.25 MG/2ML nebulizer solution Take 2 mLs (0.25 mg total) by nebulization 2 (two) times daily.             Follow-up Information    Follow up with KHAN,SAADAT A on 12/24/2011. (pulmonary -- 1000 am)    Contact information:   [address] [facility] [address] [phone]          Disposition: home Discharged Condition: Shaun Mitchell has met maximum benefit of inpatient care and is medically stable and cleared for discharge.  Patient is pending follow up as above.      Time spent on disposition:  Greater than 35 minutes.   SignedDanford Bad, NP 12/02/2011  9:37 AM  *Care during the described time interval was provided by me and/or other providers on the critical care team. I have reviewed this patient's available data, including medical history, events of note, physical examination and test results as part of my evaluation.

## 2011-12-02 NOTE — Progress Notes (Signed)
   CARE MANAGEMENT NOTE 12/02/2011  Patient:  Shaun Mitchell, Shaun Mitchell   Account Number:  [id number]  Date Initiated:  12/02/2011  Documentation initiated by:  Susane Bey  Subjective/Objective Assessment:   Order for [facility]     Action/Plan:   Met with pt and HHN ordered from [facility], pt has home oxygen, uses at night and prn only.   Anticipated DC Date:  12/02/2011   Anticipated DC Plan:  HOME/SELF CARE         Choice offered to / List presented to:     DME arranged  [facility]      DME agency  [facility]        Status of service:  Completed, signed off Medicare Important Message given?   (If response is "NO", the following Medicare IM given date fields will be blank) Date Medicare IM given:   Date Additional Medicare IM given:    Discharge Disposition:  HOME/SELF CARE  Per UR Regulation:  Reviewed for med. necessity/level of care/duration of stay  Comments:

## 2011-12-06 LAB — CULTURE, BLOOD (ROUTINE X 2)
Culture  Setup Time: 201212220105
Culture: NO GROWTH

## 2011-12-16 NOTE — Discharge Summary (Signed)
Care during the described time interval was provided by me and/or other providers on the critical care team. I have reviewed this patient's available data, including medical history, events of note, physical examination and test results as part of my evaluation.

## 2012-01-27 ENCOUNTER — Other Ambulatory Visit: Payer: Self-pay | Admitting: Adult Health

## 2012-01-30 NOTE — Telephone Encounter (Signed)
This pt was seen by Pulmonary while in the hospital but has never been seen in the office and doesn't have a pending appt.  He was instructed at discharge to f/u with Dr. Freda Munro in [place] on 12/24/11.  Will defer this to his office.

## 2013-08-28 LAB — CBC
HGB: 16 g/dL (ref 13.0–18.0)
MCH: 31.7 pg (ref 26.0–34.0)
WBC: 16.7 10*3/uL — ABNORMAL HIGH (ref 3.8–10.6)

## 2013-08-29 ENCOUNTER — Inpatient Hospital Stay: Payer: Self-pay | Admitting: Internal Medicine

## 2013-08-29 LAB — URINALYSIS, COMPLETE
Bilirubin,UR: NEGATIVE
Glucose,UR: NEGATIVE mg/dL (ref 0–75)
Ph: 6 (ref 4.5–8.0)
RBC,UR: 2 /HPF (ref 0–5)
Specific Gravity: 1.015 (ref 1.003–1.030)

## 2013-08-29 LAB — SODIUM
Sodium: 111 mmol/L — CL (ref 136–145)
Sodium: 111 mmol/L — CL (ref 136–145)
Sodium: 112 mmol/L — CL (ref 136–145)

## 2013-08-29 LAB — COMPREHENSIVE METABOLIC PANEL
BUN: 12 mg/dL (ref 7–18)
Bilirubin,Total: 1.8 mg/dL — ABNORMAL HIGH (ref 0.2–1.0)
Calcium, Total: 7.4 mg/dL — ABNORMAL LOW (ref 8.5–10.1)
Creatinine: 0.47 mg/dL — ABNORMAL LOW (ref 0.60–1.30)
EGFR (African American): >60
EGFR (Non-African Amer.): >60
Osmolality: 208 (ref 275–301)
SGOT(AST): 152 U/L — ABNORMAL HIGH (ref 15–37)
Total Protein: 5.5 g/dL — ABNORMAL LOW (ref 6.4–8.2)

## 2013-08-29 LAB — ETHANOL
Ethanol %: <0.003 % (ref 0.000–0.080)
Ethanol: <3 mg/dL

## 2013-08-29 LAB — POTASSIUM, URINE RANDOM: Potassium, Urine Random: 48 mmol/L — ABNORMAL LOW (ref 55–125)

## 2013-08-29 LAB — OSMOLALITY, URINE: Osmolality: 476 mOsm/kg

## 2013-08-29 LAB — SALICYLATE LEVEL: Salicylates, Serum: <1.7 mg/dL

## 2013-08-29 LAB — DRUG SCREEN, URINE
Benzodiazepine, Ur Scrn: NEGATIVE (ref ?–200)
MDMA (Ecstasy)Ur Screen: NEGATIVE (ref ?–500)
Methadone, Ur Screen: NEGATIVE (ref ?–300)
Phencyclidine (PCP) Ur S: NEGATIVE (ref ?–25)
Tricyclic, Ur Screen: NEGATIVE (ref ?–1000)

## 2013-08-29 LAB — MAGNESIUM: Magnesium: 1.9 mg/dL

## 2013-08-29 LAB — SODIUM, URINE, RANDOM: Sodium, Urine Random: 42 mmol/L (ref 20–110)

## 2013-08-30 LAB — CBC WITH DIFFERENTIAL/PLATELET
Basophil #: 0 10*3/uL (ref 0.0–0.1)
Basophil %: 0.2 %
Eosinophil %: 0.8 %
HCT: 39.7 % — ABNORMAL LOW (ref 40.0–52.0)
HGB: 14 g/dL (ref 13.0–18.0)
Lymphocyte #: 0.8 10*3/uL — ABNORMAL LOW (ref 1.0–3.6)
MCV: 90 fL (ref 80–100)
Monocyte #: 0.9 x10 3/mm (ref 0.2–1.0)
Monocyte %: 8.3 %
Neutrophil #: 9.3 10*3/uL — ABNORMAL HIGH (ref 1.4–6.5)
RBC: 4.43 10*6/uL (ref 4.40–5.90)
WBC: 11.2 10*3/uL — ABNORMAL HIGH (ref 3.8–10.6)

## 2013-08-30 LAB — SODIUM
Sodium: 112 mmol/L — CL (ref 136–145)
Sodium: 112 mmol/L — CL (ref 136–145)

## 2013-08-30 LAB — TSH: Thyroid Stimulating Horm: 0.45 u[IU]/mL

## 2013-08-30 LAB — COMPREHENSIVE METABOLIC PANEL
Albumin: 2.4 g/dL — ABNORMAL LOW (ref 3.4–5.0)
Anion Gap: 7 (ref 7–16)
BUN: 9 mg/dL (ref 7–18)
Bilirubin,Total: 0.8 mg/dL (ref 0.2–1.0)
Creatinine: 0.49 mg/dL — ABNORMAL LOW (ref 0.60–1.30)
EGFR (African American): >60
EGFR (Non-African Amer.): >60
Osmolality: 227 (ref 275–301)
SGPT (ALT): 77 U/L (ref 12–78)
Total Protein: 4.9 g/dL — ABNORMAL LOW (ref 6.4–8.2)

## 2013-08-30 LAB — CHLORIDE, URINE, RANDOM: Chloride, Urine Random: 30 mmol/L — ABNORMAL LOW (ref 55–125)

## 2013-08-30 LAB — SODIUM, URINE, RANDOM: Sodium, Urine Random: 9 mmol/L (ref 20–110)

## 2013-08-30 LAB — LIPID PANEL
HDL Cholesterol: 71 mg/dL — ABNORMAL HIGH (ref 40–60)
Ldl Cholesterol, Calc: 33 mg/dL (ref 0–100)
Triglycerides: 48 mg/dL (ref 0–200)

## 2013-08-31 LAB — COMPREHENSIVE METABOLIC PANEL
Alkaline Phosphatase: 77 U/L (ref 50–136)
BUN: 8 mg/dL (ref 7–18)
Bilirubin,Total: 0.8 mg/dL (ref 0.2–1.0)
Co2: 28 mmol/L (ref 21–32)
EGFR (Non-African Amer.): >60
Glucose: 110 mg/dL — ABNORMAL HIGH (ref 65–99)
Osmolality: 232 (ref 275–301)
SGPT (ALT): 61 U/L (ref 12–78)
Sodium: 115 mmol/L — CL (ref 136–145)
Total Protein: 5.5 g/dL — ABNORMAL LOW (ref 6.4–8.2)

## 2013-08-31 LAB — CBC WITH DIFFERENTIAL/PLATELET
Basophil %: 0.7 %
Eosinophil #: 0.1 10*3/uL (ref 0.0–0.7)
Lymphocyte #: 1.2 10*3/uL (ref 1.0–3.6)
Lymphocyte %: 9.1 %
MCHC: 35.8 g/dL (ref 32.0–36.0)
MCV: 91 fL (ref 80–100)
Monocyte #: 1.5 x10 3/mm — ABNORMAL HIGH (ref 0.2–1.0)
Neutrophil %: 78.1 %
RBC: 4.41 10*6/uL (ref 4.40–5.90)
WBC: 13 10*3/uL — ABNORMAL HIGH (ref 3.8–10.6)

## 2013-08-31 LAB — SODIUM: Sodium: 118 mmol/L — CL (ref 136–145)

## 2013-09-01 LAB — SODIUM: Sodium: 120 mmol/L — CL (ref 136–145)

## 2013-09-01 LAB — BASIC METABOLIC PANEL
Anion Gap: 4 — ABNORMAL LOW (ref 7–16)
BUN: 11 mg/dL (ref 7–18)
Calcium, Total: 8.2 mg/dL — ABNORMAL LOW (ref 8.5–10.1)
Chloride: 86 mmol/L — ABNORMAL LOW (ref 98–107)
Creatinine: 0.67 mg/dL (ref 0.60–1.30)
Glucose: 113 mg/dL — ABNORMAL HIGH (ref 65–99)
Osmolality: 241 (ref 275–301)
Potassium: 4.2 mmol/L (ref 3.5–5.1)

## 2013-09-02 LAB — SODIUM: Sodium: 121 mmol/L — ABNORMAL LOW (ref 136–145)

## 2013-09-02 LAB — CBC WITH DIFFERENTIAL/PLATELET
Basophil #: 0 10*3/uL (ref 0.0–0.1)
Basophil %: 0.2 %
Eosinophil #: 0.1 10*3/uL (ref 0.0–0.7)
HGB: 13.7 g/dL (ref 13.0–18.0)
MCHC: 34.3 g/dL (ref 32.0–36.0)
MCV: 93 fL (ref 80–100)
Monocyte #: 2.2 x10 3/mm — ABNORMAL HIGH (ref 0.2–1.0)
Monocyte %: 15.2 %
Neutrophil #: 10.1 10*3/uL — ABNORMAL HIGH (ref 1.4–6.5)
Neutrophil %: 71 %
Platelet: 215 10*3/uL (ref 150–440)
RBC: 4.3 10*6/uL — ABNORMAL LOW (ref 4.40–5.90)
RDW: 13.2 % (ref 11.5–14.5)
WBC: 14.3 10*3/uL — ABNORMAL HIGH (ref 3.8–10.6)

## 2013-09-03 DIAGNOSIS — I369 Nonrheumatic tricuspid valve disorder, unspecified: Secondary | ICD-10-CM

## 2013-09-03 DIAGNOSIS — I5032 Chronic diastolic (congestive) heart failure: Secondary | ICD-10-CM

## 2013-09-03 LAB — CBC WITH DIFFERENTIAL/PLATELET
Basophil #: 0 10*3/uL (ref 0.0–0.1)
Basophil %: 0.4 %
Eosinophil #: 0.1 10*3/uL (ref 0.0–0.7)
Eosinophil %: 1.1 %
HCT: 36.7 % — ABNORMAL LOW (ref 40.0–52.0)
HGB: 12.5 g/dL — ABNORMAL LOW (ref 13.0–18.0)
Lymphocyte %: 16.7 %
MCH: 32.1 pg (ref 26.0–34.0)
MCHC: 34 g/dL (ref 32.0–36.0)
Monocyte #: 1.3 x10 3/mm — ABNORMAL HIGH (ref 0.2–1.0)
Monocyte %: 15.7 %
Neutrophil #: 5.6 10*3/uL (ref 1.4–6.5)
Neutrophil %: 66.1 %
Platelet: 228 10*3/uL (ref 150–440)
RDW: 13.2 % (ref 11.5–14.5)
WBC: 8.4 10*3/uL (ref 3.8–10.6)

## 2013-09-03 LAB — BASIC METABOLIC PANEL
Anion Gap: 3 — ABNORMAL LOW (ref 7–16)
BUN: 12 mg/dL (ref 7–18)
Calcium, Total: 7.8 mg/dL — ABNORMAL LOW (ref 8.5–10.1)
Creatinine: 0.59 mg/dL — ABNORMAL LOW (ref 0.60–1.30)
Glucose: 91 mg/dL (ref 65–99)
Osmolality: 255 (ref 275–301)
Potassium: 3.9 mmol/L (ref 3.5–5.1)

## 2013-09-03 LAB — CULTURE, BLOOD (SINGLE)

## 2013-09-04 DIAGNOSIS — I4892 Unspecified atrial flutter: Secondary | ICD-10-CM

## 2013-09-04 LAB — BASIC METABOLIC PANEL
Calcium, Total: 8.3 mg/dL — ABNORMAL LOW (ref 8.5–10.1)
Chloride: 93 mmol/L — ABNORMAL LOW (ref 98–107)
Co2: 32 mmol/L (ref 21–32)
EGFR (African American): >60
EGFR (Non-African Amer.): >60
Osmolality: 257 (ref 275–301)
Sodium: 129 mmol/L — ABNORMAL LOW (ref 136–145)

## 2013-09-05 LAB — BASIC METABOLIC PANEL
Anion Gap: 2 — ABNORMAL LOW (ref 7–16)
BUN: 9 mg/dL (ref 7–18)
Calcium, Total: 8.1 mg/dL — ABNORMAL LOW (ref 8.5–10.1)
Creatinine: 0.57 mg/dL — ABNORMAL LOW (ref 0.60–1.30)
EGFR (African American): >60
Glucose: 99 mg/dL (ref 65–99)
Sodium: 131 mmol/L — ABNORMAL LOW (ref 136–145)

## 2013-09-05 LAB — CBC WITH DIFFERENTIAL/PLATELET
Basophil #: 0 10*3/uL (ref 0.0–0.1)
Basophil %: 0.5 %
Eosinophil #: 0.1 10*3/uL (ref 0.0–0.7)
HCT: 35 % — ABNORMAL LOW (ref 40.0–52.0)
Lymphocyte #: 1 10*3/uL (ref 1.0–3.6)
MCH: 32.2 pg (ref 26.0–34.0)
MCHC: 34.4 g/dL (ref 32.0–36.0)
MCV: 94 fL (ref 80–100)
Neutrophil #: 5.9 10*3/uL (ref 1.4–6.5)
Neutrophil %: 74.3 %
Platelet: 274 10*3/uL (ref 150–440)
RDW: 13.5 % (ref 11.5–14.5)
WBC: 7.9 10*3/uL (ref 3.8–10.6)

## 2013-09-06 LAB — CULTURE, BLOOD (SINGLE)

## 2013-09-07 LAB — BASIC METABOLIC PANEL
Anion Gap: 3 — ABNORMAL LOW (ref 7–16)
BUN: 11 mg/dL (ref 7–18)
Calcium, Total: 8.5 mg/dL (ref 8.5–10.1)
Co2: 32 mmol/L (ref 21–32)
Creatinine: 0.6 mg/dL (ref 0.60–1.30)
EGFR (African American): >60
EGFR (Non-African Amer.): >60
Osmolality: 262 (ref 275–301)
Potassium: 4.3 mmol/L (ref 3.5–5.1)
Sodium: 131 mmol/L — ABNORMAL LOW (ref 136–145)

## 2013-09-08 ENCOUNTER — Telehealth: Payer: Self-pay

## 2013-09-08 NOTE — Telephone Encounter (Signed)
Patient contacted regarding discharge from [facility] on 09/07/13.  Patient understands to follow up with provider Dr. Mariah Milling on 09/14/13 at 1:00 at [facility] office. Patient understands discharge instructions? yes Patient understands medications and regiment? yes Patient understands to bring all medications to this visit? yes

## 2013-09-14 ENCOUNTER — Encounter: Payer: Self-pay | Admitting: Cardiovascular Disease

## 2013-09-14 ENCOUNTER — Ambulatory Visit (INDEPENDENT_AMBULATORY_CARE_PROVIDER_SITE_OTHER): Payer: Medicaid Other | Admitting: Cardiovascular Disease

## 2013-09-14 VITALS — BP 80/62 | HR 69 | Ht 65.0 in | Wt 141.5 lb

## 2013-09-14 DIAGNOSIS — J441 Chronic obstructive pulmonary disease with (acute) exacerbation: Secondary | ICD-10-CM

## 2013-09-14 DIAGNOSIS — G9341 Metabolic encephalopathy: Secondary | ICD-10-CM

## 2013-09-14 DIAGNOSIS — I4891 Unspecified atrial fibrillation: Secondary | ICD-10-CM

## 2013-09-14 DIAGNOSIS — R000 Tachycardia, unspecified: Secondary | ICD-10-CM

## 2013-09-14 DIAGNOSIS — J9620 Acute and chronic respiratory failure, unspecified whether with hypoxia or hypercapnia: Secondary | ICD-10-CM

## 2013-09-14 DIAGNOSIS — R600 Localized edema: Secondary | ICD-10-CM

## 2013-09-14 DIAGNOSIS — F1010 Alcohol abuse, uncomplicated: Secondary | ICD-10-CM

## 2013-09-14 DIAGNOSIS — K30 Functional dyspepsia: Secondary | ICD-10-CM

## 2013-09-14 DIAGNOSIS — I4892 Unspecified atrial flutter: Secondary | ICD-10-CM | POA: Insufficient documentation

## 2013-09-14 MED ORDER — METOPROLOL TARTRATE 50 MG PO TABS: 50.0000 mg | ORAL_TABLET | Freq: Two times a day (BID) | ORAL | Status: DC

## 2013-09-14 MED ORDER — METOPROLOL TARTRATE 25 MG PO TABS: 25.0000 mg | ORAL_TABLET | Freq: Two times a day (BID) | ORAL | Status: AC

## 2013-09-14 MED ORDER — AMIODARONE HCL 100 MG PO TABS: 100.0000 mg | ORAL_TABLET | Freq: Every day | ORAL | Status: AC

## 2013-09-14 NOTE — Assessment & Plan Note (Signed)
Albumin 2.8 on arrival to the hospital. Suspect his leg edema today could be secondary to poor nutrition.

## 2013-09-14 NOTE — Progress Notes (Signed)
Patient ID: DOSS CYBULSKI, male    DOB: 1951-11-25, 62 y.o.   MRN: [id number]  HPI Comments: Mr. Nabers is a pleasant 62 year old gentleman with chronic  alcohol abuse, polycythemia, hypertension, hyperlipidemia, COPD with recent severe alcohol abuse sent to the emergency room at [facility] 08/29/2013. Discharged on 09/07/2013 Found to have severe hyponatremia with sodium 102. He is not intoxicated on presentation. He was treated with fluid restriction, saline IV fluid with improvement of his sodium. Cardiology was consult in for atrial flutter with 2:1 conduction.  He was started on diltiazem, digoxin with no improvement, changed to amiodarone and converted to normal sinus rhythm. In the hospital, albumin 2.8.Elevated LFTs with ALT 105, AST 152  He was discharged with followup today.Sodium improved up to 131 on September 30. He had a thick cough and had hypoxia during his hospital course suggestive of bronchitis.  Echocardiogram in the hospital showed normal ejection fraction Greater than 55-60%, mild TR    in followup today, he Reports that his back has been hurting and is requesting pain medications. He denies any tachycardia or palpitations. He reports having some mild edema above the sock line. He denies any tachycardia or palpitations concerning for atrial flutter.  he reports blood pressure has been running low but he does not have symptoms.  Hospital records total cholesterol 114, LDL 33, HDL 71  EKG shows normal sinus rhythm with rate 69 beats per minute, nonspecific ST abnormality in lead V5, V6     Outpatient Encounter Prescriptions as of 09/14/2013  Medication Sig Dispense Refill  . albuterol (PROVENTIL HFA;VENTOLIN HFA) 108 (90 BASE) MCG/ACT inhaler Inhale 2 puffs into the lungs every 6 (six) hours as needed. For shortness of breath       . albuterol (PROVENTIL) (5 MG/ML) 0.5% nebulizer solution Take 0.5 mLs (2.5 mg total) by nebulization 3 (three) times daily.  75 mL  0  . amiodarone  (PACERONE) 100 MG tablet Take 1 tablet (100 mg total) by mouth daily.  30 tablet  6  . budesonide-formoterol (SYMBICORT) 160-4.5 MCG/ACT inhaler Inhale 2 puffs into the lungs 2 (two) times daily.      . metoprolol (LOPRESSOR) 25 MG tablet Take 1 tablet (25 mg total) by mouth every 12 (twelve) hours.  60 tablet  11  . omeprazole (PRILOSEC) 20 MG capsule Take 20 mg by mouth 2 (two) times daily.      . simvastatin (ZOCOR) 20 MG tablet Take 20 mg by mouth every evening.      . tiotropium (SPIRIVA) 18 MCG inhalation capsule Place 18 mcg into inhaler and inhale daily.        Review of Systems  Constitutional: Positive for fatigue.  HENT: Negative.   Eyes: Negative.   Respiratory: Negative.   Cardiovascular: Negative.   Gastrointestinal: Negative.   Endocrine: Negative.   Musculoskeletal: Positive for back pain.  Skin: Negative.   Allergic/Immunologic: Negative.   Neurological: Negative.   Hematological: Negative.   Psychiatric/Behavioral: Negative.   All other systems reviewed and are negative.    BP 80/62  Pulse 69  Ht 5\' 5"  (1.651 m)  Wt 141 lb 8 oz (64.184 kg)  BMI 23.55 kg/m2 Blood pressure was verified by myself and remained low Physical Exam  Nursing note and vitals reviewed. Constitutional: He is oriented to person, place, and time. He appears well-developed and well-nourished.  Appears disheveled  HENT:  Head: Normocephalic.  Nose: Nose normal.  Mouth/Throat: Oropharynx is clear and moist.  Eyes: Conjunctivae are  normal. Pupils are equal, round, and reactive to light.  Neck: Normal range of motion. Neck supple. No JVD present.  Cardiovascular: Normal rate, regular rhythm, S1 normal, S2 normal, normal heart sounds and intact distal pulses.  Exam reveals no gallop and no friction rub.   No murmur heard. Trace to 1+ pitting edema above the sock line bilaterally  Pulmonary/Chest: Effort normal. No respiratory distress. He has decreased breath sounds. He has no wheezes. He  has no rales. He exhibits no tenderness.  Abdominal: Soft. Bowel sounds are normal. He exhibits no distension. There is no tenderness.  Musculoskeletal: Normal range of motion. He exhibits no edema and no tenderness.  Lymphadenopathy:    He has no cervical adenopathy.  Neurological: He is alert and oriented to person, place, and time. Coordination normal.  Skin: Skin is warm and dry. No rash noted. No erythema.  Psychiatric: He has a normal mood and affect. His behavior is normal. Judgment and thought content normal.      Assessment and Plan

## 2013-09-14 NOTE — Assessment & Plan Note (Signed)
Lung function seems to have improved from when he was in the hospital. Resolving URI. Unable to exclude acute on chronic diastolic CHF in the setting of atrial flutter.

## 2013-09-14 NOTE — Assessment & Plan Note (Signed)
Recommended alcohol cessation. This is likely the etiology of his low sodium.

## 2013-09-14 NOTE — Assessment & Plan Note (Signed)
Recommended cessation. He reports that he is on and off again. This is likely because of his recent admission to the hospital with profoundly low sodium.

## 2013-09-14 NOTE — Assessment & Plan Note (Signed)
Maintaining normal sinus rhythm. We have suggested he stay on his low-dose amiodarone 100 mg daily. We will decrease the metoprolol down to 25 mg daily as his blood pressure today is 75-80 systolic.

## 2013-09-14 NOTE — Patient Instructions (Addendum)
You are doing well. Please decrease the metoprolol to 25 mg once a day Your blood pressure is low  Please call us if you have new issues that need to be addressed before your next appt.  Your physician wants you to follow-up in: 6 months.  You will receive a reminder letter in the mail two months in advance. If you don't receive a letter, please call our office to schedule the follow-up appointment.

## 2013-09-14 NOTE — Assessment & Plan Note (Signed)
He has nighttime oxygen. Also on inhalers. He reports he is at his baseline.

## 2013-10-04 ENCOUNTER — Encounter: Payer: Self-pay | Admitting: *Deleted

## 2013-10-07 DIAGNOSIS — G9341 Metabolic encephalopathy: Secondary | ICD-10-CM

## 2013-10-07 DIAGNOSIS — F1010 Alcohol abuse, uncomplicated: Secondary | ICD-10-CM

## 2013-10-07 DIAGNOSIS — R609 Edema, unspecified: Secondary | ICD-10-CM

## 2013-10-07 DIAGNOSIS — K3189 Other diseases of stomach and duodenum: Secondary | ICD-10-CM

## 2013-10-07 DIAGNOSIS — I4892 Unspecified atrial flutter: Secondary | ICD-10-CM

## 2013-10-07 DIAGNOSIS — J441 Chronic obstructive pulmonary disease with (acute) exacerbation: Secondary | ICD-10-CM

## 2013-10-07 DIAGNOSIS — J9620 Acute and chronic respiratory failure, unspecified whether with hypoxia or hypercapnia: Secondary | ICD-10-CM

## 2013-10-07 DIAGNOSIS — R000 Tachycardia, unspecified: Secondary | ICD-10-CM

## 2013-10-21 LAB — ETHANOL: Ethanol %: <0.003 % (ref 0.000–0.080)

## 2013-10-21 LAB — CBC
HGB: 13.3 g/dL (ref 13.0–18.0)
MCH: 30.4 pg (ref 26.0–34.0)
MCHC: 32.3 g/dL (ref 32.0–36.0)
Platelet: 261 10*3/uL (ref 150–440)
RBC: 4.39 10*6/uL — ABNORMAL LOW (ref 4.40–5.90)
WBC: 6.1 10*3/uL (ref 3.8–10.6)

## 2013-10-21 LAB — COMPREHENSIVE METABOLIC PANEL
Alkaline Phosphatase: 59 U/L (ref 50–136)
Anion Gap: 3 — ABNORMAL LOW (ref 7–16)
BUN: 19 mg/dL — ABNORMAL HIGH (ref 7–18)
Calcium, Total: 8.4 mg/dL — ABNORMAL LOW (ref 8.5–10.1)
Chloride: 108 mmol/L — ABNORMAL HIGH (ref 98–107)
Co2: 32 mmol/L (ref 21–32)
Creatinine: 0.86 mg/dL (ref 0.60–1.30)
EGFR (Non-African Amer.): >60
Glucose: 81 mg/dL (ref 65–99)
Osmolality: 286 (ref 275–301)
Potassium: 3.5 mmol/L (ref 3.5–5.1)
SGOT(AST): 34 U/L (ref 15–37)
SGPT (ALT): 20 U/L (ref 12–78)
Total Protein: 6.3 g/dL — ABNORMAL LOW (ref 6.4–8.2)

## 2013-10-21 LAB — ACETAMINOPHEN LEVEL: Acetaminophen: <2 ug/mL

## 2013-10-21 LAB — CK TOTAL AND CKMB (NOT AT ARMC): CK-MB: 3.8 ng/mL — ABNORMAL HIGH (ref 0.5–3.6)

## 2013-10-22 ENCOUNTER — Inpatient Hospital Stay: Payer: Self-pay | Admitting: Internal Medicine

## 2013-10-22 LAB — URINALYSIS, COMPLETE
Bacteria: NONE SEEN
Blood: NEGATIVE
Glucose,UR: NEGATIVE mg/dL (ref 0–75)
Hyaline Cast: 5
Leukocyte Esterase: NEGATIVE
Ph: 5 (ref 4.5–8.0)
Specific Gravity: 1.027 (ref 1.003–1.030)

## 2013-10-22 LAB — DRUG SCREEN, URINE
Barbiturates, Ur Screen: NEGATIVE (ref ?–200)
Benzodiazepine, Ur Scrn: NEGATIVE (ref ?–200)
Cannabinoid 50 Ng, Ur ~~LOC~~: NEGATIVE (ref ?–50)
Cocaine Metabolite,Ur ~~LOC~~: NEGATIVE (ref ?–300)
Opiate, Ur Screen: POSITIVE (ref ?–300)
Phencyclidine (PCP) Ur S: NEGATIVE (ref ?–25)
Tricyclic, Ur Screen: NEGATIVE (ref ?–1000)

## 2013-10-22 LAB — CBC WITH DIFFERENTIAL/PLATELET
Basophil #: 0 10*3/uL (ref 0.0–0.1)
Eosinophil #: 0 10*3/uL (ref 0.0–0.7)
Eosinophil %: 0 %
MCH: 30.5 pg (ref 26.0–34.0)
MCHC: 32.1 g/dL (ref 32.0–36.0)
MCV: 95 fL (ref 80–100)
Monocyte #: 0 x10 3/mm — ABNORMAL LOW (ref 0.2–1.0)
Neutrophil #: 2.9 10*3/uL (ref 1.4–6.5)
Neutrophil %: 86.1 %
Platelet: 235 10*3/uL (ref 150–440)
RBC: 4.32 10*6/uL — ABNORMAL LOW (ref 4.40–5.90)
RDW: 14.2 % (ref 11.5–14.5)

## 2013-10-22 LAB — BASIC METABOLIC PANEL
Anion Gap: 5 — ABNORMAL LOW (ref 7–16)
BUN: 15 mg/dL (ref 7–18)
Chloride: 108 mmol/L — ABNORMAL HIGH (ref 98–107)
EGFR (African American): >60
EGFR (Non-African Amer.): >60
Glucose: 118 mg/dL — ABNORMAL HIGH (ref 65–99)
Potassium: 3.5 mmol/L (ref 3.5–5.1)

## 2013-10-22 LAB — AMMONIA: Ammonia, Plasma: 33 mcmol/L — ABNORMAL HIGH (ref 11–32)

## 2013-10-23 LAB — AMMONIA: Ammonia, Plasma: 38 mcmol/L — ABNORMAL HIGH (ref 11–32)

## 2013-10-23 LAB — SALICYLATE LEVEL: Salicylates, Serum: 3.2 mg/dL — ABNORMAL HIGH

## 2013-10-24 LAB — COMPREHENSIVE METABOLIC PANEL
Alkaline Phosphatase: 49 U/L — ABNORMAL LOW (ref 50–136)
Calcium, Total: 8.4 mg/dL — ABNORMAL LOW (ref 8.5–10.1)
Co2: 37 mmol/L — ABNORMAL HIGH (ref 21–32)
Glucose: 127 mg/dL — ABNORMAL HIGH (ref 65–99)
SGPT (ALT): 19 U/L (ref 12–78)
Total Protein: 5.5 g/dL — ABNORMAL LOW (ref 6.4–8.2)

## 2013-10-25 LAB — BASIC METABOLIC PANEL
BUN: 12 mg/dL (ref 7–18)
Calcium, Total: 8.4 mg/dL — ABNORMAL LOW (ref 8.5–10.1)
Chloride: 106 mmol/L (ref 98–107)
Co2: 38 mmol/L — ABNORMAL HIGH (ref 21–32)
Creatinine: 0.6 mg/dL (ref 0.60–1.30)
Osmolality: 286 (ref 275–301)
Sodium: 143 mmol/L (ref 136–145)

## 2013-10-25 LAB — MAGNESIUM: Magnesium: 2 mg/dL

## 2013-12-08 ENCOUNTER — Inpatient Hospital Stay: Payer: Self-pay | Admitting: Internal Medicine

## 2013-12-08 LAB — SODIUM, URINE, RANDOM: Sodium, Urine Random: 11 mmol/L (ref 20–110)

## 2013-12-08 LAB — RAPID INFLUENZA A&B ANTIGENS

## 2013-12-08 LAB — CBC
HCT: 43.2 % (ref 40.0–52.0)
MCH: 29.7 pg (ref 26.0–34.0)
MCHC: 32.9 g/dL (ref 32.0–36.0)
MCV: 92 fL (ref 80–100)
Platelet: 223 10*3/uL (ref 150–440)
RBC: 4.75 10*6/uL (ref 4.40–5.90)
RBC: 4.75 10*6/uL (ref 4.40–5.90)
WBC: 12.9 10*3/uL — ABNORMAL HIGH (ref 3.8–10.6)

## 2013-12-08 LAB — ETHANOL
Ethanol %: <0.003 % (ref 0.000–0.080)
Ethanol: <3 mg/dL

## 2013-12-08 LAB — BASIC METABOLIC PANEL
Anion Gap: 6 — ABNORMAL LOW (ref 7–16)
Anion Gap: 4 — ABNORMAL LOW (ref 7–16)
BUN: 11 mg/dL (ref 7–18)
BUN: 13 mg/dL (ref 7–18)
Calcium, Total: 8.3 mg/dL — ABNORMAL LOW (ref 8.5–10.1)
Calcium, Total: 7.4 mg/dL — ABNORMAL LOW (ref 8.5–10.1)
Chloride: 99 mmol/L (ref 98–107)
Co2: 26 mmol/L (ref 21–32)
Co2: 26 mmol/L (ref 21–32)
Co2: 27 mmol/L (ref 21–32)
Creatinine: 0.71 mg/dL (ref 0.60–1.30)
Creatinine: 0.71 mg/dL (ref 0.60–1.30)
Creatinine: 0.67 mg/dL (ref 0.60–1.30)
EGFR (African American): >60
EGFR (African American): >60
EGFR (Non-African Amer.): >60
Glucose: 80 mg/dL (ref 65–99)
Glucose: 117 mg/dL — ABNORMAL HIGH (ref 65–99)
Osmolality: 246 (ref 275–301)
Osmolality: 258 (ref 275–301)
Osmolality: 264 (ref 275–301)
Potassium: 4.8 mmol/L (ref 3.5–5.1)
Sodium: 131 mmol/L — ABNORMAL LOW (ref 136–145)

## 2013-12-08 LAB — SODIUM: Sodium: 125 mmol/L — ABNORMAL LOW (ref 136–145)

## 2013-12-08 LAB — CHLORIDE, URINE, RANDOM: Chloride, Urine Random: 22 mmol/L — ABNORMAL LOW (ref 55–125)

## 2013-12-08 LAB — TROPONIN I
Troponin-I: <0.02 ng/mL
Troponin-I: <0.02 ng/mL

## 2013-12-08 LAB — CREATININE, URINE, RANDOM: Creatinine, Urine Random: 38.2 mg/dL (ref 30.0–125.0)

## 2013-12-09 LAB — BASIC METABOLIC PANEL
Anion Gap: 3 — ABNORMAL LOW (ref 7–16)
BUN: 11 mg/dL (ref 7–18)
Calcium, Total: 8.3 mg/dL — ABNORMAL LOW (ref 8.5–10.1)
Chloride: 104 mmol/L (ref 98–107)
Co2: 26 mmol/L (ref 21–32)
Creatinine: 0.54 mg/dL — ABNORMAL LOW (ref 0.60–1.30)
EGFR (African American): >60
EGFR (Non-African Amer.): >60
Glucose: 156 mg/dL — ABNORMAL HIGH (ref 65–99)
Osmolality: 269 (ref 275–301)
Potassium: 4.3 mmol/L (ref 3.5–5.1)
Sodium: 133 mmol/L — ABNORMAL LOW (ref 136–145)

## 2013-12-09 LAB — CBC WITH DIFFERENTIAL/PLATELET
Basophil #: 0 10*3/uL (ref 0.0–0.1)
Basophil %: 0 %
Eosinophil #: 0 10*3/uL (ref 0.0–0.7)
Eosinophil %: 0 %
HCT: 39.1 % — ABNORMAL LOW (ref 40.0–52.0)
HGB: 12.6 g/dL — ABNORMAL LOW (ref 13.0–18.0)
Lymphocyte #: 0.4 10*3/uL — ABNORMAL LOW (ref 1.0–3.6)
Lymphocyte %: 2.9 %
MCH: 29.7 pg (ref 26.0–34.0)
MCHC: 32.3 g/dL (ref 32.0–36.0)
MCV: 92 fL (ref 80–100)
Monocyte #: 0.5 10*3/uL (ref 0.2–1.0)
Monocyte %: 3.2 %
Neutrophil #: 13.7 10*3/uL — ABNORMAL HIGH (ref 1.4–6.5)
Neutrophil %: 93.9 %
Platelet: 235 10*3/uL (ref 150–440)
RBC: 4.25 10*6/uL — ABNORMAL LOW (ref 4.40–5.90)
RDW: 14.2 % (ref 11.5–14.5)
WBC: 14.6 10*3/uL — ABNORMAL HIGH (ref 3.8–10.6)

## 2013-12-09 LAB — LIPASE, BLOOD: Lipase: 58 U/L — ABNORMAL LOW (ref 73–393)

## 2013-12-09 LAB — TSH: Thyroid Stimulating Horm: 0.065 u[IU]/mL — ABNORMAL LOW

## 2013-12-09 LAB — PHOSPHORUS: Phosphorus: 1.5 mg/dL — ABNORMAL LOW (ref 2.5–4.9)

## 2013-12-09 LAB — MAGNESIUM: Magnesium: 2 mg/dL

## 2013-12-09 LAB — T4, FREE: FREE THYROXINE: 0.98 ng/dL (ref 0.76–1.46)

## 2013-12-10 LAB — PHOSPHORUS: PHOSPHORUS: 2.1 mg/dL — AB (ref 2.5–4.9)

## 2013-12-10 LAB — BASIC METABOLIC PANEL
Anion Gap: 6 — ABNORMAL LOW (ref 7–16)
BUN: 13 mg/dL (ref 7–18)
CALCIUM: 8.4 mg/dL — AB (ref 8.5–10.1)
Chloride: 105 mmol/L (ref 98–107)
Co2: 25 mmol/L (ref 21–32)
Creatinine: 0.49 mg/dL — ABNORMAL LOW (ref 0.60–1.30)
EGFR (Non-African Amer.): >60
Glucose: 146 mg/dL — ABNORMAL HIGH (ref 65–99)
Osmolality: 275 (ref 275–301)
Potassium: 4.3 mmol/L (ref 3.5–5.1)
SODIUM: 136 mmol/L (ref 136–145)

## 2013-12-10 LAB — MAGNESIUM: MAGNESIUM: 1.7 mg/dL — AB

## 2013-12-11 LAB — BASIC METABOLIC PANEL
Anion Gap: 5 — ABNORMAL LOW (ref 7–16)
BUN: 19 mg/dL — ABNORMAL HIGH (ref 7–18)
CALCIUM: 8.3 mg/dL — AB (ref 8.5–10.1)
CHLORIDE: 108 mmol/L — AB (ref 98–107)
CREATININE: 0.53 mg/dL — AB (ref 0.60–1.30)
Co2: 27 mmol/L (ref 21–32)
GLUCOSE: 148 mg/dL — AB (ref 65–99)
Osmolality: 284 (ref 275–301)
POTASSIUM: 4.5 mmol/L (ref 3.5–5.1)
SODIUM: 140 mmol/L (ref 136–145)

## 2013-12-11 LAB — CBC WITH DIFFERENTIAL/PLATELET
BASOS PCT: 0.1 %
Basophil #: 0 10*3/uL (ref 0.0–0.1)
Eosinophil #: 0 10*3/uL (ref 0.0–0.7)
Eosinophil %: 0 %
HCT: 39 % — ABNORMAL LOW (ref 40.0–52.0)
HGB: 12.6 g/dL — AB (ref 13.0–18.0)
Lymphocyte #: 0.6 10*3/uL — ABNORMAL LOW (ref 1.0–3.6)
Lymphocyte %: 7.2 %
MCH: 29.9 pg (ref 26.0–34.0)
MCHC: 32.3 g/dL (ref 32.0–36.0)
MCV: 92 fL (ref 80–100)
Monocyte #: 0.3 x10 3/mm (ref 0.2–1.0)
Monocyte %: 3.5 %
Neutrophil #: 7.1 10*3/uL — ABNORMAL HIGH (ref 1.4–6.5)
Neutrophil %: 89.2 %
PLATELETS: 241 10*3/uL (ref 150–440)
RBC: 4.22 10*6/uL — AB (ref 4.40–5.90)
RDW: 14.4 % (ref 11.5–14.5)
WBC: 8 10*3/uL (ref 3.8–10.6)

## 2013-12-11 LAB — PHOSPHORUS: Phosphorus: 3 mg/dL (ref 2.5–4.9)

## 2013-12-11 LAB — MAGNESIUM: Magnesium: 2.4 mg/dL

## 2013-12-12 LAB — BASIC METABOLIC PANEL
Anion Gap: 1 — ABNORMAL LOW (ref 7–16)
BUN: 21 mg/dL — AB (ref 7–18)
CALCIUM: 8.3 mg/dL — AB (ref 8.5–10.1)
CHLORIDE: 107 mmol/L (ref 98–107)
Co2: 32 mmol/L (ref 21–32)
Creatinine: 0.47 mg/dL — ABNORMAL LOW (ref 0.60–1.30)
EGFR (African American): >60
Glucose: 146 mg/dL — ABNORMAL HIGH (ref 65–99)
Osmolality: 285 (ref 275–301)
POTASSIUM: 4.5 mmol/L (ref 3.5–5.1)
Sodium: 140 mmol/L (ref 136–145)

## 2013-12-12 LAB — CBC WITH DIFFERENTIAL/PLATELET
Basophil #: 0 10*3/uL (ref 0.0–0.1)
Basophil %: 0.3 %
Eosinophil #: 0 10*3/uL (ref 0.0–0.7)
Eosinophil %: 0.1 %
HCT: 39.6 % — AB (ref 40.0–52.0)
HGB: 12.8 g/dL — AB (ref 13.0–18.0)
Lymphocyte #: 0.3 10*3/uL — ABNORMAL LOW (ref 1.0–3.6)
Lymphocyte %: 6.7 %
MCH: 30 pg (ref 26.0–34.0)
MCHC: 32.2 g/dL (ref 32.0–36.0)
MCV: 93 fL (ref 80–100)
Monocyte #: 0.3 x10 3/mm (ref 0.2–1.0)
Monocyte %: 5.7 %
NEUTROS ABS: 4.5 10*3/uL (ref 1.4–6.5)
NEUTROS PCT: 87.2 %
Platelet: 247 10*3/uL (ref 150–440)
RBC: 4.26 10*6/uL — ABNORMAL LOW (ref 4.40–5.90)
RDW: 14.6 % — ABNORMAL HIGH (ref 11.5–14.5)
WBC: 5.1 10*3/uL (ref 3.8–10.6)

## 2013-12-12 LAB — MAGNESIUM: MAGNESIUM: 2 mg/dL

## 2013-12-12 LAB — PHOSPHORUS: Phosphorus: 3.1 mg/dL (ref 2.5–4.9)

## 2013-12-13 LAB — BASIC METABOLIC PANEL
ANION GAP: 4 — AB (ref 7–16)
BUN: 24 mg/dL — ABNORMAL HIGH (ref 7–18)
CO2: 32 mmol/L (ref 21–32)
Calcium, Total: 8.3 mg/dL — ABNORMAL LOW (ref 8.5–10.1)
Chloride: 107 mmol/L (ref 98–107)
Creatinine: 0.52 mg/dL — ABNORMAL LOW (ref 0.60–1.30)
EGFR (African American): >60
EGFR (Non-African Amer.): >60
GLUCOSE: 146 mg/dL — AB (ref 65–99)
Osmolality: 292 (ref 275–301)
Potassium: 4.6 mmol/L (ref 3.5–5.1)
Sodium: 143 mmol/L (ref 136–145)

## 2013-12-15 LAB — PHOSPHORUS: Phosphorus: 4.1 mg/dL (ref 2.5–4.9)

## 2013-12-16 LAB — BASIC METABOLIC PANEL
ANION GAP: 3 — AB (ref 7–16)
BUN: 24 mg/dL — ABNORMAL HIGH (ref 7–18)
CALCIUM: 8.2 mg/dL — AB (ref 8.5–10.1)
Chloride: 101 mmol/L (ref 98–107)
Co2: 32 mmol/L (ref 21–32)
Creatinine: 0.68 mg/dL (ref 0.60–1.30)
EGFR (Non-African Amer.): >60
Glucose: 122 mg/dL — ABNORMAL HIGH (ref 65–99)
Osmolality: 277 (ref 275–301)
POTASSIUM: 4 mmol/L (ref 3.5–5.1)
Sodium: 136 mmol/L (ref 136–145)

## 2013-12-16 LAB — MAGNESIUM: Magnesium: 1.8 mg/dL

## 2013-12-17 LAB — BASIC METABOLIC PANEL
Anion Gap: 2 — ABNORMAL LOW (ref 7–16)
BUN: 22 mg/dL — ABNORMAL HIGH (ref 7–18)
CALCIUM: 8.1 mg/dL — AB (ref 8.5–10.1)
CHLORIDE: 101 mmol/L (ref 98–107)
CO2: 34 mmol/L — AB (ref 21–32)
Creatinine: 0.69 mg/dL (ref 0.60–1.30)
Glucose: 105 mg/dL — ABNORMAL HIGH (ref 65–99)
Osmolality: 278 (ref 275–301)
POTASSIUM: 3.4 mmol/L — AB (ref 3.5–5.1)
Sodium: 137 mmol/L (ref 136–145)

## 2013-12-17 LAB — URINALYSIS, COMPLETE
Bilirubin,UR: NEGATIVE
Glucose,UR: NEGATIVE mg/dL (ref 0–75)
Ketone: NEGATIVE
Nitrite: NEGATIVE
Ph: 7 (ref 4.5–8.0)
Protein: NEGATIVE
Specific Gravity: 1.011 (ref 1.003–1.030)
WBC UR: 85 /HPF (ref 0–5)

## 2013-12-17 LAB — MAGNESIUM: Magnesium: 1.9 mg/dL

## 2013-12-21 LAB — CBC WITH DIFFERENTIAL/PLATELET
BASOS ABS: 0.1 10*3/uL (ref 0.0–0.1)
Basophil %: 0.4 %
EOS PCT: 0 %
Eosinophil #: 0 10*3/uL (ref 0.0–0.7)
HCT: 40.4 % (ref 40.0–52.0)
HGB: 13.3 g/dL (ref 13.0–18.0)
Lymphocyte #: 0.5 10*3/uL — ABNORMAL LOW (ref 1.0–3.6)
Lymphocyte %: 3.7 %
MCH: 29.8 pg (ref 26.0–34.0)
MCHC: 32.8 g/dL (ref 32.0–36.0)
MCV: 91 fL (ref 80–100)
MONOS PCT: 4.6 %
Monocyte #: 0.6 x10 3/mm (ref 0.2–1.0)
NEUTROS ABS: 12.6 10*3/uL — AB (ref 1.4–6.5)
NEUTROS PCT: 91.3 %
PLATELETS: 278 10*3/uL (ref 150–440)
RBC: 4.45 10*6/uL (ref 4.40–5.90)
RDW: 14.2 % (ref 11.5–14.5)
WBC: 13.8 10*3/uL — AB (ref 3.8–10.6)

## 2013-12-21 LAB — BASIC METABOLIC PANEL
Anion Gap: 3 — ABNORMAL LOW (ref 7–16)
BUN: 18 mg/dL (ref 7–18)
CO2: 34 mmol/L — AB (ref 21–32)
Calcium, Total: 8.1 mg/dL — ABNORMAL LOW (ref 8.5–10.1)
Chloride: 96 mmol/L — ABNORMAL LOW (ref 98–107)
Creatinine: 0.51 mg/dL — ABNORMAL LOW (ref 0.60–1.30)
Glucose: 120 mg/dL — ABNORMAL HIGH (ref 65–99)
Osmolality: 269 (ref 275–301)
POTASSIUM: 4 mmol/L (ref 3.5–5.1)
Sodium: 133 mmol/L — ABNORMAL LOW (ref 136–145)

## 2014-05-09 DEATH — deceased

## 2015-03-31 NOTE — Consult Note (Signed)
Brief Consult Note: Diagnosis: Delirium of unknown origin, alcohol dependence in early remission.   Patient was seen by consultant.   Recommend further assessment or treatment.   Orders entered.   Comments: [name] has a h/o alcoholism but has been clean of alcohol since his [facility]. He appears to be delirius. He is alert and fully oriented now but earlier thought it was 2020 and Shaun Mitchell is the president.   PLAN: 1. The patient does not have the capacity to decide about disposition today. Will reevaluate tomorrow.  Electronic Signatures: Kristine LineaPucilowska, Ladarius Seubert (MD)  (Signed 17-Nov-14 17:01)  Authored: Brief Consult Note   Last Updated: 17-Nov-14 17:01 by Kristine LineaPucilowska, Jacque Garrels (MD)

## 2015-03-31 NOTE — Discharge Summary (Signed)
PATIENT NAME:  Shaun Mitchell, Shaun Mitchell MR#:  [id number] DATE OF BIRTH:  1951/05/05  DATE OF ADMISSION:  08/29/2013 DATE OF DISCHARGE:  09/07/2013  ADMITTING DIAGNOSES: Altered mental status and hyponatremia.  DISCHARGE DIAGNOSIS:  1.  Acute encephalopathy, felt to be due to severe hyponatremia.  2.  Alcohol abuse with possible withdrawal, treated with CIWA protocol.  3.  History of hypertension. The patient was initially hypotensive.  4.  Possible cystitis.  5.  Acute respiratory failure requiring high O2 due to possible chronic obstructive pulmonary disease flare and/or due to rib fractures. The patient not taking deep breaths. CT scan of the chest was negative.  6.  Atrial fibrillation noted during hospitalization, likely related to his alcohol abuse. He was treated with amiodarone and is on amiodarone. This will be needed for short term as per cardiology evaluation.  7.  Tobacco abuse.  8.  Gastroesophageal reflux disease.   CONSULTANTS DURING HOSPITALIZATION: [facility]. Dr. Kirke Corin.   LABS: Admitting WBC count 16.7, hemoglobin 16, platelet count was 108. Tylenol level was 10 to 30. A CT scan of the head without contrast showed no acute intracranial processes. C-spine showed no cervical spine injury. Urine culture showed greater than 100,000 CFUs of Enterococcus faecalis. TUDS were negative. Urinalysis showed 1 leukocyte esterase, WBCs were 97, salicylates were less than 1.7. BMP: Glucose 84, BUN 12, creatinine 0.42, sodium was 102, potassium 4.2, chloride was 68, calcium 7.4. LFTs: ALT was 105, AST 152, total protein was 5.5. ABG: pH 7.37, pCO2 of 40, pO2 of 60. Hand x-rays on the left, no fracture. Chest x-ray showed no cardiopulmonary processes. CT of the chest, abdomen and pelvis showed a 5 mm left lower lobe pulmonary nodule, no acute injury of the chest or abdomen. Blood cultures no growth. Most recent sodium on 09/30 was 131. Echocardiogram of the heart showed ejection fraction 55% to 60%,  normal left ventricular systolic function and mild concentric left ventricular hypertrophy.   HOSPITAL COURSE: Please refer to H and P done by the admitting physician. The patient is a 64 year old white male with severe alcohol abuse, who was brought in with altered mental status and severely low sodium. The patient was aggressively treated with IV normal saline. His sodium was allowed to increase slowly. The patient's mental status started improving. It was felt to be due to a combination of fluid depletion as well as . He was seen by nephrology and his sodium is much improved. We recommended the patient to stop drinking. Next issue during hospitalization, the patient developed A. fib. with RVR and had to be placed on amiodarone drip. Cardizem drip did not help. His heart rate is much improved. He has been followed by cardiology during hospitalization and they recommended that he will need a short course of amiodarone treatment for home. He is not felt a great candidate for any type of anticoagulation except aspirin. The patient also was having significant hypoxia and was noted to have rib fractures. His CT per PE protocol was negative for PE. The patient have was using oxygen only at nighttime. Currently, he is going to need oxygen at all time. He will need to be re-evaluated to see if he needs to have continuous oxygen. In terms of his alcohol DTs, the patient did have evidence of DTs initially. However, he is asymptomatic and now doing well. He was recommended to stop drinking. At this time, he is stable for discharge.   DISCHARGE MEDICATIONS: Metoprolol 50 mg 1 tab p.o. b.i.d.,  doxepin 10 mg 1 tab p.o. b.i.d. as needed, omeprazole 20 mg 1 tab p.o. b.i.d., tramadol 50 mg 1 tab p.o. b.i.d. as needed, Daliresp 500 mcg daily, Spiriva 18 mcg daily, aspirin 325 mg p.o. daily, Symbicort 2 puffs b.i.d., multivitamin daily and amiodarone 200 mg 1 tab p.o. b.i.d.   Home health with physical therapy and nurse case  manager arranged.   Home oxygen: 2 L at all time.   DIET: Regular in light of his low sodium.   ACTIVITY: As tolerated.   FOLLOWUP: With primary M.D. in 1 to 2 weeks. Follow with Dr. Mariah MillingGollan in 1 to 2 weeks.   NOTE: 35 minutes spent on the discharge.  ____________________________ Lacie ScottsShreyang H. Allena KatzPatel, MD shp:aw D: 09/08/2013 08:36:36 ET T: 09/08/2013 08:51:44 ET JOB#: [id number]  cc: Neziah Vogelgesang H. Allena KatzPatel, MD, <Dictator> Charise CarwinSHREYANG H Chaselyn Nanney MD ELECTRONICALLY SIGNED 09/09/2013 16:22

## 2015-03-31 NOTE — Discharge Summary (Signed)
PATIENT NAME:  Shaun Mitchell, Shaun Mitchell MR#:  [id number] DATE OF BIRTH:  06-May-1951  DATE OF ADMISSION:  10/22/2013 DATE OF DISCHARGE:  10/26/2013  DISCHARGE DIAGNOSES:   1.  Acute hypoxic respiratory failure, likely due to chronic obstructive pulmonary disease exacerbation, now resolved, back to his baseline oxygen requirement.  2.  Acute metabolic encephalopathy, likely due to CO2 retention versus hypoxia versus alcohol withdrawal, but now close to baseline.  3.  Recurrent fall, likely multifactorial, possibly from alcohol abuse.   SECONDARY DIAGNOSES: 1.  Hypertension.  2.  Hyperlipidemia.  3.  Secondary polycythemia.  4.  Gastroesophageal reflux disease.  5.  Alcohol abuse.  6.  Chronic obstructive pulmonary disease.   CONSULTATION:  1.  Physical therapy.  2.  Psychiatry.   PROCEDURES AND RADIOLOGY: Chest x-ray on 13th of November showed COPD/emphysema without any acute cardiopulmonary disease.   CT scan of the head without contrast on 13th of November showed no acute pathology.   MAJOR LABORATORY PANEL: UA on admission was negative. Serum salicylate level was 3.2 and was high up to 19.1 on admission, on 13th of November, which came down to 16.0 on the 14th of November, and Tylenol level was less than 2.  HISTORY AND SHORT HOSPITAL COURSE: The patient is a 64 year old male with above-mentioned medical problems, who was admitted for acute hypoxic respiratory failure thought to be secondary to COPD exacerbation. The patient was also found to have acute encephalopathy, which was thought to be secondary to CO2 retention versus hypoxia versus alcohol withdrawal. Certainly, a component of salicylate toxicity was not ruled out, as his salicylate level was also elevated. The patient's salicylate level started coming down during his inpatient stay. Please see Dr. Clarita Leber dictated history and physical for further details. Psychiatry consultation was obtained per Dr. Jennet Maduro to evaluate for  decision-making capacity, and she did indicate that the patient was able to make his own decision and certainly, at that point, the decision was made that the patient could be discharged home, as that is what he wanted to do, even though at times it was felt that he could have a better disposition, but the patient was adamant just to go home.  On 18th of November, his mental status was back to baseline, and his respiratory status was also close to baseline and was discharged home. On the date of discharge, his vital signs were as follows: Temperature 98.9, heart rate 93 per minute, respirations 20 per minute, blood pressure 164/86 mmHg, was saturating 91% on room air.   PERTINENT PHYSICAL EXAMINATION ON THE DATE OF DISCHARGE:  CARDIOVASCULAR: S1, S2 normal. No murmurs, rubs or gallop.  LUNGS: Clear to auscultation bilaterally. No wheezing, rales, rhonchi or crepitation.  ABDOMEN: Soft, benign.  NEUROLOGIC: Nonfocal examination. All other physical examination remained at baseline.   DISCHARGE MEDICATIONS: 1.  Tramadol 50 mg p.o. b.i.d. as needed.  2.  Spiriva once daily.  3.  Amiodarone 200 mg p.o. b.i.d.  4.  Omeprazole 20 mg p.o. b.i.d.  5.  Hydrochlorothiazide/losartan 25/100 mg 1 tablet p.o. daily.  6.  Naprosyn 375 mg p.o. b.i.d. as needed.  7.   Daliresp 500 mcg p.o. daily.   DISCHARGE DIET: Low sodium.   DISCHARGE ACTIVITY: As tolerated.   DISCHARGE INSTRUCTIONS AND FOLLOWUP: The patient was instructed to follow up with his primary care physician.   Total time discharging this patient was 55 minutes.    ____________________________ Ellamae Sia. Sherryll Burger, MD vss:dmm D: 10/27/2013 21:35:56 ET T: 10/27/2013 22:21:43  ET JOB#: [id number]  cc: Soila Printup S. Sherryll BurgerShah, MD, <Dictator> Jolanta B. Jennet MaduroPucilowska, MD Patricia PesaVIPUL S Evvie Behrmann MD ELECTRONICALLY SIGNED 10/28/2013 16:23

## 2015-03-31 NOTE — Consult Note (Signed)
Brief Consult Note: Diagnosis: Alcohol dependence, substance induced mood disorder.   Patient was seen by consultant.   Recommend further assessment or treatment.   Orders entered.   Comments: [name] relapsed on alcohol after 3 years of sobriety here for severe hyponatremia. He is unable to participate in a psychiatric interview yet.   He is intereted in treatment of substance abuse and depression. I will return tomorrow to complete interview.  Electronic Signatures: Kristine LineaPucilowska, Marena Witts (MD)  (Signed 22-Sep-14 17:03)  Authored: Brief Consult Note   Last Updated: 22-Sep-14 17:03 by Kristine LineaPucilowska, Jarica Plass (MD)

## 2015-03-31 NOTE — H&P (Signed)
PATIENT NAME:  Shaun Mitchell, [name] MR#:  [id number] DATE OF BIRTH:  1951-09-04  DATE OF ADMISSION:  08/29/2013  PRIMARY CARE PHYSICIAN:  No primary care physician.   REFERRING PHYSICIAN: Dr. Dolores FrameSung.   CHIEF COMPLAINT: Altered mental status and low sodium.   HISTORY OF PRESENT ILLNESS: The patient is a 64 year old Caucasian male with a past medical history of secondary polycythemia, hypertension, hyperlipidemia, COPD, severe alcohol abuse and GERD, is sent over to the ER via EMS for altered mental status. According to EMS, the brother was concerned that the patient was belligerent and binge drinking for the past 2 weeks. Brother was concerned, and called EMS who brought him into the ER. In the ER, the patient was noticed with multiple bruises all over his body, regarding which he had a CAT scan of the head as well as CT chest, abdomen and pelvis. The patient also had CT of the cervical spine and CT of maxillofacial area without contrast. CT head has revealed no acute abnormalities and other CTs showed there were no acute fractures or bleeding. The patient's serum sodium was noticed at 102. The patient has received 1 liter of normal saline fluid bolus and he is placed on CIWA protocol. Serum alcohol level is less than 0.003. LFTs are elevated and the patient is found to be thrombocytopenic. No family members are available at bedside. The patient is maintaining his airway and his pulse ox is at 97% to 98% on 2 liters of oxygen. The patient is lethargic, but arousable. He is stating that he is doing all right and falling asleep. I was unable to get any history from the patient. According to the ER physician, who was able to have some conversation with him, the patient was not ready to quit drinking alcohol.   PAST MEDICAL HISTORY: Secondary polycythemia, followed by Dr. Orlie DakinFinnegan, which was thought to be related to heavy tobacco abuse, hypertension, hyperlipidemia, GERD, alcohol abuse, COPD.   PAST SURGICAL  HISTORY: Cholecystectomy.   ALLERGIES: He has no known drug allergies.    HOME MEDICATIONS: Tramadol 50 mg 2 times a day as needed, Wellbutrin 20 mg once daily, metoprolol 50 mg 1 to 2 times a day, hydrochlorothiazide 1 tablet p.o. once daily, potassium chloride 10 mEq once daily, Daliresp  500 mcg once daily.   PSYCHOSOCIAL HISTORY: Is revealing that he is residing with his brother, apparently alcohol abuse and, from the old medical records, tobacco abuse.   FAMILY HISTORY: Unobtainable.    REVIEW OF SYSTEMS: Unobtainable, because of the altered mental status.  PHYSICAL EXAMINATION:  VITAL SIGNS: Temperature 98.3, pulse is 68, respirations 18, blood pressure 92/58. Pulse ox of 93% on 2 liters.   GENERAL APPEARANCE: Not in acute distress, but the patient is quite lethargic. Moderately built and nourished with multiple bruises all over his body, also superficial indentations and excoriations are noticed.   HEENT: Normocephalic. Pupils are equally reactive to light and accommodation. No conjunctival injection. No sinus tenderness. Dry mucous membranes.   NECK: Supple. No JVD. No thyromegaly.   LUNGS: Clear to auscultation bilaterally. No accessory muscle use and no anterior chest wall tenderness on palpation.   HEART: S1 and S2 normal. Regular rate and rhythm. No murmurs.   GASTROINTESTINAL: Soft. Bowel sounds are positive in all four quadrants. Nontender, nondistended, positive hepatomegaly. No other masses felt.   NEUROLOGICAL: Quite lethargic, but arousable to his name and falling asleep, disoriented, not following verbal commands. Reflexes are 2+.   EXTREMITIES: No edema. No  cyanosis. No clubbing.   SKIN: Warm to touch with multiple bruises, excoriations, indentations, lacerations.  No purulent discharge noted from any of them.   PSYCHIATRIC: Mood and affect could not be elicited, as the patient is with altered mental status.  LABORATORY AND IMAGING STUDIES:  pH 7.37, pCO2 40,  pO2 60, bicarb is 23.1. Lactic acid is 8.7 on 21% FiO2, serum acetaminophen level less than 2, salicylate level less than 1.7. PT 14.2, INR is 1.1. Urine drug screen is negative. Urinalysis yellow in color, hazy in appearance, glucose negative, bilirubin negative, ketones 1+, nitrite negative, leukocyte esterase 1+. WBC 16.7, hemoglobin 15.0, hematocrit 44.4. platelets 108. MCV is 88. LFTs: total protein 5.5, albumin 2.8, bilirubin total is 1.8, osmolality was 77, AST 152. Serum glucose is 84, BUN 12, creatinine 0.47. Serum sodium 102, potassium 4.2, serum chloride is 68, serum bicarb is 24, GFR greater than 50, calcium of 7.4, serum osmolality 208. Anion gap is 10. Repeat STAT serum sodium is ordered, which is pending.  CAT scan of the head without contrast has revealed no acute intracranial process. CT maxillofacial area without contrast, no acute osseous injury of the maxillofacial bones. CT of the cervical spine without contrast, no acute osseous injury of the cervical spine, ligamentous injury is not evaluated. If there was a high clinical concern of ligamentous injury, consult MRI or flexion extension radiograph as clinically indicated and tolerated. CAT scan of the chest, no evidence of acute traumatic injury in the chest, narrowing of the right mid lobe bronchus. It is uncertain whether it is secondary to bronchial secondary to endobronchial lesion, recommended clinical correlation and further evaluation with the bronchoscope is also recommended if that has not previously been evaluated, post obstructive atelectasis in the right middle lobe, a small amount of debris from the right mainstem bronchus. Mild emphysematous changes in the lungs. Dependent atelectasis in the left lung, multiple small noncalcified nodules in the right lung. CT of the abdomen and pelvis with IV contrast:  No evidence of acute traumatic injury in the abdomen and pelvis, mild fatty infiltration of the liver, scattered diverticula in  the colon without diverticulitis. EKG has revealed sinus rhythm at 87 beats minutes with premature atrial complexes. Normal PR and QRS intervals. No acute ST-T wave changes.   ASSESSMENT AND PLAN: A 64 year old Caucasian male was sent over to ER by his brother for altered mental status with agitation has history of severe alcohol abuse, will be admitted with the following assessment and plan.   1.  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 Will admit the patient to Critical Care Unit. The patient has received normal saline bolus in the ER.  2.  The patient is on CIWA protocol. Banana bag with D5 normal saline at 40 mL per hour. We will get serial sodium levels every 2 to 3 hours. We will check fasting lipid panel and thyroid stimulating hormone. We will get random urine electrolytes and urine osmolality. Nephrology consult is placed and discussed with Dr. Cherylann Ratel who is not recommending hypertonic saline at this time.  Will titrate his IV fluids based on the serum sodium levels.  3.  Severe alcohol abuse with multiple bruises. The patient is on CIWA protocol.  4.  Hypertension. Currently, the patient is hypotensive. Will provide IV fluids.  5.  Hyperlipidemia. Check fasting  lipid panel.  6.  Secondary polycythemia.  7.  Acute cystitis. The patient is on IV Rocephin and urine cultures were ordered.  8.  Had old right middle lobe bronchus ? lesion noticed from mucus plugging. Also, patient had some pulmonary nodules. Further work up by the rounding physician once the patient is clinically stabilized  9.  Gastrointestinal prophylaxis with Protonix IV.  10.  Deep vein thrombosis prophylaxis with sequential compression devices in view of thrombocytopenia.  11.  He is  Full Code status. No family members are available at bedside and we are unable to reach any family members.  Critical care time spent:  60 minutes.    ____________________________ Ramonita Lab, MD ag:nts D: 08/29/2013 04:38:17 ET T: 08/29/2013 05:38:58 ET JOB#: [id number]  cc: Ramonita Lab, MD, <Dictator> Munsoor Lizabeth Leyden, MD Ramonita Lab MD ELECTRONICALLY SIGNED 08/31/2013 23:52

## 2015-03-31 NOTE — Consult Note (Signed)
PATIENT NAME:  Shaun Mitchell, [name] MR#:  [id number] DATE OF BIRTH:  [birth year]  DATE OF CONSULTATION:  09/03/2013  CONSULTING PHYSICIAN:  Denetria Luevanos A. Kirke CorinArida, MD  REQUESTING PHYSICIAN:  Dr. Nemiah CommanderKalisetti.    REASON FOR CONSULTATION: Atrial flutter with rapid ventricular response.   HISTORY OF PRESENT ILLNESS: This is a 64 year old Caucasian male with past medical history of secondary polycythemia, hypertension, hyperlipidemia, COPD and severe alcohol abuse. He was sent to the Emergency Room via EMS due to altered mental status. He was noted to be severely hyponatremic with  a sodium of 102. He was not intoxicated with alcohol on presentation. The patient was treated with fluid restriction and normal saline IV fluid. He had gradual improvement in his hyponatremia. His sodium today was 127. EKG on presentation showed sinus rhythm with PACs. Overnight, he developed atrial flutter with rapid ventricular response on 2 to 1 conduction. Heart rate has been difficult to control since then. He was transferred to the ICU and started on diltiazem drip, with no significant improvement. He was given one dose of IV digoxin with only slight improvement. His heart rate currently is still 140 beats per minute. There is no reported previous cardiac history. I am not able to obtain any history from the patient who is currently lethargic.   PAST MEDICAL HISTORY: 1.  Secondary polycythemia.  2.  Heavy tobacco use.  3.  Hypertension.  4.  Hyperlipidemia.  5.  Alcohol abuse.  6.  COPD. 7.  Gastroesophageal reflux disease.   PAST SURGICAL HISTORY:  Includes cholecystectomy.   ALLERGIES: No known drug allergies.   HOME MEDICATIONS:  Tramadol, Wellbutrin, metoprolol 50 mg twice daily, hydrochlorothiazide 1 tablet daily, potassium chloride once daily.   SOCIAL HISTORY: Remarkable for heavy alcohol use and tobacco use.   FAMILY HISTORY: Unable to obtain at this time.   REVIEW OF SYSTEMS: Unable to obtain due to lethargy.    PHYSICAL EXAMINATION: GENERAL: The patient is lethargic. He does not appear to be in acute distress.  VITAL SIGNS: Temperature 97.9, pulse is 144, blood pressure is 136/74, oxygen saturation is 98% on high flow nasal cannula.  HEENT: Normocephalic, atraumatic.  NECK: No JVD or carotid bruits.  RESPIRATORY: Normal respiratory effort with no use of accessory muscles. Auscultation reveals normal breath sounds.  CARDIOVASCULAR: Normal PMI but tachycardic. No gallops or murmurs are appreciated.  ABDOMEN: Benign, nontender, and nondistended.  EXTREMITIES: Trace edema bilaterally.  SKIN: Warm and dry with no rash.  PSYCHIATRIC: The patient is lethargic.   LABORATORY AND DIAGNOSTIC DATA: ECG on presentation showed sinus rhythm with PACs. Current rhythm strip shows atrial flutter with a 2 to 1 conduction and occasional variable block. Labs showed a creatinine of 0.59, sodium improved to 127 with a potassium of 3.9. Magnesium was 1.7.   IMPRESSION:   1.  Atrial flutter with rapid ventricular response: It appears that the patient does not have a previous cardiac history. Atrial flutter is likely due to electrolyte abnormalities, fluid resuscitation, and less likely to be due to DTs. He did not respond to diltiazem and he is still tachycardic. Usually rate control in atrial flutter is more difficult than atrial fibrillation. It might be worth trying to convert him to sinus rhythm pharmacologically. Due to that, I recommend stopping diltiazem and starting amiodarone drip. Continue to replace electrolytes and treat  underlying illness. I will also write an order for IV metoprolol to be used as needed for heart rate above 120. He would probably respond better  to a beta blocker than a calcium channel blocker due to high adrenergic state. The patient is not a candidate for anticoagulation due to severe alcohol abuse. I will obtain an echocardiogram given no recent evaluation.  2.  Severe hyponatremia this has  been gradually improving.  3.  Severe alcohol abuse, we will have to exclude alcohol-induced cardiomyopathy. An echocardiogram was requested.    ____________________________ Chelsea Aus. Kirke Corin, MD maa:dp D: 09/03/2013 13:40:12 ET T: 09/03/2013 14:06:08 ET JOB#: [id number]  cc: Joany Khatib A. Kirke Corin, MD, <Dictator> Enid Baas, MD [facility] [place] Donovan MD ELECTRONICALLY SIGNED 09/20/2013 14:00

## 2015-03-31 NOTE — Consult Note (Signed)
PATIENT NAMMarland Kitchen:  Shaun LyonsSUTTON, Raghav S Mitchell OF BIRTH:  1950-12-21 OF ADMISSION:  11/14/2014OF CONSULTATION: 10/25/2013 PHYSICIAN: Susa GriffinsPadmaja Vasireddy, MD PHYSICIAN: Kristine LineaJolanta Kayman Snuffer, MD  FOR CONSULTATION: Capacity evaluation.  COMPLAINT: "I am ready to go home."  DATA: Shaun Mitchell is a 64 year old male with a history of alcoholism.   OF PRESENT ILLNESS: Shaun Mitchell was admitted to medical floor for hypoxemia. The patient has a history of alcoholism but had been sober for the past 3 years until he relapsed 1 month ago. He was [facility] for detox and has been sober ever since. This is confirmed by the patient as well as his family present at bedside. However, the family reports that the patient has been confused and falling since his last hospitalization. The family believes that it is unsafe for the patient to return home where he takes care of ill and alcoholic brother and that the patient should be placed. The patient adamantly denies that caring for his brother is too difficult for him but admits that he feels frustrated at times. He promises to use his oxygen at home from now on as if he was not compliant with oxygen therapy prior to admission. He denies any psychiatric history other than alcoholism and underscores that he has been sober for the past month. He denies suicidal or homicidal ideation. There are no psychotic symptoms. PSYCHGIATRIC HISTORY: Heavy drinking until 3 years ago.  PSYCHIATRIC HISTORY: He lives with an alcoholic brother.  MEDICAL HISTORY: Hypertension. Hyperlipidemia. Secondary polycythemia, followed by Dr. Orlie DakinFinnegan. Heavy tobacco use. Gastroesophageal reflux disease. COPD.  No known drug allergies.  MEDICATIONS: Tramadol 50 mg 2 times a day. Spiriva 18 mcg once a day. Omeprazole 20 mg 2 times a day. Naprosyn 375 mg 2 times a day. Hydrochlorothiazide once a day. Daliresp 500 mcg once a day. Amiodarone 200 mg 2 times a day.  HISTORY: The patient is retired but used to continue  working 2-3 days a week when feeling better. He used to care for his mother who is now deceased and now cares for his brother. He has supportive family but they live 50 miles away. He still smokes even though he is using oxygen.   REVIEW OF SYSTEMS:No fevers or chills. Positive for fatigue.  No double or blurred vision. No hearing loss. Positive for shortness of breath or cough. H is breathing oxygen.No chest pain or orthopnea. No abdominal pain, nausea, vomiting or diarrhea. No incontinence or frequency. No heat or cold intolerance. No anemia or easy bruising. No acne or rash. No muscle or joint pain. No tingling or weakness. See history of present illness for details.   EXAMINATION: This is well-nourished male in no acute distress.   SIGNS: Blood pressure 138/89, pulse 73, respirations 20, temperature 97.8. rest of the physical examination is deferred to his primary attending.  DATA: CMP is completely within normal limits. CBC is completely within normal limits. Cardiac enzymes: One set is well within normal limits. Chest x-ray, PA and lateral: No acute cardiopulmonary disease, emphysema. Troponin 0.04. Alcohol less than 0.03. Acetaminophen less than 2. CT head without contrast: No acute intracranial abnormality. Ammonia: 42. UDS: positive for opioids.   MENTAL STATUS EXAMINATION: The patient is alert and oriented to person, place, time and situation. 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 He now says that he was kidding. He is pleasant, polite and cooperative but determined to go home as soon as possible. He is well groomed wearing  a hospital gown. He maintains good eye contact. His speech is loud and pressured at times. His mood is "good" with full affect. Thought process is logical. Thought content: he denies suicidal or homicidal ideation, delusions or paranoia. He denies hallucinations. His cognition is difficult to assess. His insight and judgment are  poor.  I:  Alcohol dependence. Delirium of unknown origin possibly from elevated ammonia. abuse. AXIS II:  Deferred.  AXIS III:  COPD, Hypertensionm, Hyperlipidemia, Gastroesophageal reflux disease, alcoholic liver disease.   AXIS IV:  Physical illness, substance abuse, treatment compliance. V:  GAF 50.   PLAN: 1. The patient is intermittently confused and unable to decide about discharge today. He is determined to return to home and if/when confusion resolves he will likely rerurn to home in spite of family concerns.  Confusion, falls and slurred speech present on admission likely result from elevated ammonia.    No medications recommended.  The patient is proud to maintain alcohol sobriety but he plays down opiate abuse. He is not interested in substance abuse treatment.    I will return tomorrow to assess mental status.   Electronic Signatures: Kristine Linea (MD)  (Signed on [phone] 08:27)  Authored  Last Updated: [id number] 08:27 by Kristine Linea (MD)

## 2015-03-31 NOTE — H&P (Signed)
PATIENT NAME:  Shaun Mitchell, Shaun Mitchell MR#:  [id number] DATE OF BIRTH:  1951-08-11  DATE OF ADMISSION:  10/22/2013  PRIMARY CARE PHYSICIAN: None.   REFERRING PHYSICIAN: Elvera Maria, PA   CHIEF COMPLAINT: Shortness of breath, altered mental status.   HISTORY OF PRESENT ILLNESS: [name] is a 64 year old male with a history of heavy alcohol use, heavy tobacco use, hypertension, hyperlipidemia, who was brought to the Emergency Department hypoxic, with the O2 saturation of 74%, improved to 90% with 3 liters of oxygen. Could not obtain any history from the patient as the patient is in altered mental status. The history is mainly obtained from the patient's brother. Per brother, the patient was drinking heavily until 2 years back and was sober for 2 years. About a month back, drank heavily for 1 week. Since then, has been confused, frequently falling down. The patient has been having slurred speech. The patient lives with another brother who is also a heavy alcoholic. Today, when the patient's brother was with him, he was in altered mental status, slurred speech, falling down. Concerning this, called EMS. When EMS arrived, as mentioned above, the patient had low oxygen saturations and bilateral diffuse wheezing.  ABG obtained in the Emergency Department shows pCO2 of 63, did not mention about PaO2. Alcohol level is less than 0.003. CT head without contrast showed no acute intracranial abnormality. Otherwise, lab work is unremarkable. The patient's brother was uncertain about the patient's p.o. intake. In the Emergency Department, the patient received levofloxacin, breathing treatments and Solu-Medrol.   PAST MEDICAL HISTORY:  1. Hypertension.  2. Hyperlipidemia.  3. Secondary polycythemia, followed by Dr. Orlie Dakin.  4. Heavy tobacco use.  5. Gastroesophageal reflux disease.  6. Alcohol abuse.  7. COPD.   PAST SURGICAL HISTORY: Cholecystectomy.   ALLERGIES: No known drug allergies.   HOME MEDICATIONS:   1. Tramadol 50 mg 2 times a day.  2. Spiriva 18 mcg once a day.  3. Omeprazole 20 mg 2 times a day.  4. Naprosyn 375 mg 2 times a day.  5. Hydrochlorothiazide once a day.  6. Daliresp 500 mcg once a day.  7. Amiodarone 200 mg 2 times a day.   SOCIAL HISTORY: Smokes heavily, unknown quantity. Per brother, smokes multiple packs in a day. Per brother, has not been drinking; however, the patient's brother lives 50 miles away from the patient. The patient lives with another brother who is a heavy alcoholic.   FAMILY HISTORY: History of hypertension.   REVIEW OF SYSTEMS: Could not be obtained from the patient secondary to altered mental status.   PHYSICAL EXAMINATION:  GENERAL: This is a well-built, well-nourished , lying down in the bed, not in distress. Illogical speech.  VITAL SIGNS: Temperature 99.3, pulse 84, blood pressure 103/59, respiratory rate of 22, oxygen saturation is 96% on 3 liters of oxygen.  HEENT: Head normocephalic, atraumatic. Eyes: No sclerae icterus. Conjunctivae normal. Pupils equal and reactive to light. Mucous membranes dry. Could not examine the oropharynx.  NECK: Supple. No lymphadenopathy. No JVD. No carotid bruit. No thyromegaly.  CHEST: Has no focal tenderness. Bilateral diffuse wheezing.  HEART: S1 and S2, regular, tachycardia. No murmurs are heard.  ABDOMEN: Bowel sounds present. Soft, nontender, nondistended. Could not appreciate the hepatosplenomegaly.  EXTREMITIES: No pedal edema. Pulses 2+.  NEUROLOGIC: The patient is alert, oriented to person, but not to place and time. No apparent cranial nerve abnormalities. Left arm has some mild wrist drop. Moving all 4 extremities. Could not examine the  sensory.  SKIN: Has multiple abrasions from falls.  MUSCULOSKELETAL: Could not examine; however, seems to be having good range of motion.   LABORATORY DATA: CMP is completely within normal limits. CBC is completely within normal limits. Cardiac enzymes: One set is well  within normal limits. Chest x-ray, PA and lateral: No acute cardiopulmonary disease, emphysema. Troponin 0.04. Alcohol less than 0.03. Acetaminophen less than 2. CT head without contrast: No acute intracranial abnormality.   ASSESSMENT AND PLAN: Shaun Mitchell is a 64 year old male with known history of alcohol and tobacco use, who is brought to the Emergency Department for altered mental status.   1. Altered mental status. This seems to be a combination of hypoxia, hypercapnia, possible underlying  from poor p.o. intake as well as alcohol use and possible underlying hepatic encephalopathy and current infection. Will treat the chronic obstructive pulmonary disease, supplemental oxygen. Continue with the breathing treatments, Solu-Medrol. Keep the patient on thiamine 100 mg b.i.d. Check the ammonia level.  2. Chronic obstructive pulmonary disease exacerbation, as mentioned above. Continue with Levaquin. The patient has a bad cough. Breathing treatments and Solu-Medrol.  3. Frequent falls, possibility of cerebellar atrophy from heavy alcohol use; however, the patient's brother states that this is more of an acute to subacute onset. Will obtain MRI of the brain. Will also involve the physical therapy, occupational therapy.  4. Heavy alcohol use. Uncertain when the patient's last alcohol use was. Keep the patient on thiamine. Keep the patient on CIWA protocol.  5. History of atrial fibrillation. The patient has been placed on amiodarone during the previous admission in September 2014.  6. Hypertension, currently well controlled. However, will hold the hydrochlorothiazide. The patient seems to be dehydrated.  7. Heavy tobacco use. Will keep the patient on nicotine patch. 8. Keep the patient on deep vein thrombosis prophylaxis with Lovenox.   TIME SPENT: 60 minutes.   ____________________________ Susa GriffinsPadmaja Lorynn Moeser, MD pv:lb D: 10/22/2013 06:14:02 ET T: 10/22/2013 07:00:28 ET JOB#: [id number]  cc: Susa GriffinsPadmaja  Vetra Shinall, MD, <Dictator> Susa GriffinsPADMAJA Ainsley Deakins MD ELECTRONICALLY SIGNED 11/07/2013 0:37

## 2015-03-31 NOTE — Consult Note (Signed)
PATIENT NAME:  Shaun Mitchell, Shaun Mitchell MR#:  [id number] DATE OF BIRTH:  Jul 06, 1951  DATE OF CONSULTATION:  08/29/2013  REQUESTING PHYSICIAN: Dr. Ramonita Lab.   CONSULTING PHYSICIAN:  Basim Bartnik Lizabeth Leyden, MD  REASON FOR CONSULTATION: Severe hyponatremia.   HISTORY OF PRESENT ILLNESS: The patient is a 64 year old Caucasian male with past medical history of secondary polycythemia, hypertension, hyperlipidemia, GERD,  alcohol abuse, and COPD who was brought to [facility] for altered mental status. Currently no history is able to be obtained from the patient.  In reviewing records it appears that the patient's brother was concerned that the patient become belligerent after binge drinking for the past two weeks. The patient's brother subsequently called EMS and he was brought to the Emergency Department. He was found to have significant bruises all over his body. Initial work-up in the Emergency Department demonstrated severely low serum sodium of 102. The patient was administered 2 liters of normal saline while in the Emergency Department. He had some urine electrolytes performed and urine sodium was 42 with a urine osmolality of 476. It appears that he was taking hydrochlorothiazide at home as well. The patient's serum alcohol level was negligible. He was subsequently admitted to the Critical Care Unit. Initial repeat sodium was 107 and most recently at approximately 8:00 a.m. his serum sodium was up to 111. I spoke to Dr. Amado Coe about the patient early this a.m. close to 5:00 a.m. We advised that the rate of sodium correction should not exceed 10 mEq over 24 hours.   PAST MEDICAL HISTORY: 1. Hypertension.  2. Hyperlipidemia.  3. GERD.  4. Alcohol abuse.  5. COPD.  6. Secondary polycythemia secondary to heavy tobacco abuse.   PAST SURGICAL HISTORY: Cholecystectomy.   ALLERGIES: No known drug allergies.   CURRENT INPATIENT MEDICATIONS: Include:  1. D5 normal saline supplemented with  magnesium sulfate, thiamine, and folic acid at 40 mL/h.  2. Ciprofloxacin 0.3% ophthalmic drops one drop in the right eye q.4 h.  3. Haldol 2 mg IV every four hours p.r.n.  4. Lorazepam 2 mg IV q.1h. p.r.n. 5. Multivitamin 1 tablet p.o. daily.  6. Nicotine patch 21 mg transdermal daily.  7. Nitroglycerin 2% 1 inch topical q.6 hours p.r.n.  8. Daliresp 500 mcg daily.  9. Zofran 4 mg IV q.6 hours p.r.n.  10. Protonix 40 mg IV q.6 a.m.  11. Ceftriaxone 1 gram IV every 24 hours   SOCIAL HISTORY: Unable to obtain directly from the patient. However, it appears that he has history of both tobacco and alcohol use.   FAMILY HISTORY: Currently unobtainable as the patient unable to provide this information at present.  REVIEW OF SYSTEMS: Unobtainable secondary to altered mental status.   PHYSICAL EXAMINATION: VITAL SIGNS: Temperature 98.9, pulse 84, respirations 18, blood pressure 94/53, pulse oximetry 96% on 2 liters.  GENERAL: Reveals a lethargic Caucasian male seen in the Critical Care Unit.  HEENT: There is a significant right periorbital ecchymosis noted. There is a bruise also noted on his forehead. No epistaxis noted. Difficult to assess hearing. Oral mucosa are dry.  NECK: Supple without JVD or lymphadenopathy.  LUNGS: Demonstrate rhonchi bilaterally with normal respiratory effort.  HEART: S1, S2 regular rate and rhythm. No murmurs, rubs, or gallops appreciated.  ABDOMEN: Soft, nontender, nondistended. Bowel sounds positive. No rebound or guarding. No gross organomegaly appreciated.  EXTREMITIES: No clubbing, cyanosis, or edema.  NEUROLOGIC: The patient is lethargic and difficult to arouse.  GENITOURINARY: Foley catheter in place.  MUSCULOSKELETAL: No joint redness, swelling or tenderness appreciated.  SKIN: Warm and dry. Scattered ecchymoses noted on all four extremities.  PSYCHIATRIC: Unable to assess at this time.   LABS: Initial serum sodium of 102, potassium 4.2, chloride 68, CO2  24, BUN 12, creatinine 0.47, glucose 84, anion gap 10, serum ethanol level less than 3, total protein 5.5, albumin 2.8, total bilirubin 1.8, AST 152, ALT 105. Urine drug screen negative. CBC shows WBC 16.7, hemoglobin 16, hematocrit is 44, and platelets 108, INR 1.1. Urine sodium 42. Urine osmolality 476, serum potassium 48. Blood cultures x2 sets are negative. Urinalysis shows urine protein of 30 mg/dL, 2 RBCs per high-power field, 97 WBCs per high-power field, 1+ bacteria. ABG shows pH 7.37, pCO2 of 40, pO2 of 60, FiO2 21%.   IMPRESSION: This is a 64 year old Caucasian male with past medical history of hypertension, hyperlipidemia, gastroesophageal reflux disease, alcohol abuse, chronic obstructive pulmonary disease, secondary polycythemia thought to be secondary to heavy tobacco abuse who presented to [facility] with altered mental status and found to have severe hypernatremia with initial serum sodium of 102 after binge drinking.   PROBLEM LIST: 1. Severe hyponatremia.  2. Altered mental status secondary to hyponatremia.  3. Alcohol abuse.  4. Pyuria.   PLAN: The patient presented with a very severe hyponatremia. Initial serum sodium was found to be 102. He likely had very poor salt intake while on alcohol binge. The patient was administered 2 liters of normal saline in the Emergency Department. Serum sodium this a.m. is 111. The rate of correction is a bit high at present. Therefore, we will discontinue normal saline infusion. We will actually place the patient on D5W at 50 mL/h in order to slow the rate of correction. Hopefully, his mental status should improve over the course of the hospitalization. The patient has been placed on CIWA protocol for alcohol withdrawal as well. In addition, he was found to have pyuria. He has been placed on ceftriaxone for now, which we agree with. I would also await urine culture. I would like to thank Dr. Amado CoeGouru for this kind referral. We  recommend continued close monitoring of the serum sodium throughout today, and would recommend serum sodium checks every two hours.    ____________________________ Lennox PippinsMunsoor N. Clenton Esper, MD mnl:sg D: 08/29/2013 11:43:00 ET T: 08/29/2013 11:58:00 ET JOB#: [id number]  cc: Lennox PippinsMunsoor N. Herschell Virani, MD, <Dictator> Lennox PippinsMUNSOOR N Ronia Hazelett MD ELECTRONICALLY SIGNED 09/02/2013 10:23

## 2015-04-01 NOTE — H&P (Signed)
PATIENT NAME:  Shaun Mitchell, Avonte S MR#:  [id number] DATE OF BIRTH:  28-May-1951  DATE OF ADMISSION:  12/08/2013  PRIMARY CARE PHYSICIAN: Nonlocal.   REFERRING PHYSICIAN: Rebecka ApleyAllison P. Webster, MD   CHIEF COMPLAINT: Acute shortness of breath.   HISTORY OF PRESENT ILLNESS: The patient is a 64 year old Caucasian male with a past medical history of heavy tobacco use, alcoholic use, hypertension, hyperlipidemia, COPD, who is brought into the ER for shortness of breath and hypoxemia. The patient was brought into the  ER via EMS for shortness of breath with hypoxemia. The patient was using all his muscles and working on room air. His pulse oximetry was 78% on room air, as reported by the ER. The patient has received 125 mg of IV Solu-Medrol, 2 DuoNeb treatments, 2 grams of magnesium sulfate, and he was placed on 4 liters of oxygen. By the time he came into the ER, his pulse oximetry was at 94%, and lung sounds were diminished. The patient is supposed to use home oxygen and nebulizers, but he is quite noncompliant. No family members are present at bedside during my examination. The patient's initial VBG has revealed a pH of 7.24 with pCO2 of 65. Chest x-ray revealed no acute changes. The patient was placed on BiPAP, and subsequently, his FiO2 was cut down to 33%. Hospitalist team is called to admit the patient. During my examination, the patient was lethargic, although he is opening his eyes, he was unable to answer any of my questions and falling asleep. He is working hard and using all his accessory muscles while he is on BiPAP. STAT ABG was called, which has revealed a pH of 7.14, pCO2 73 and pO2 of 92. As the patient is in acute respiratory failure and not improving on BiPAP, I have requested the ER physician, Dr. Bayard Malesandolph Brown, to intubate the patient. Repeat sodium is ordered after the patient received 1500 mL of fluid boluses. I have tried to reach family members, but I was unable to contact any one of them and left  a voicemail to [phone] at around 5:30 a.m. As the patient has history of alcohol abuse, serum alcohol is ordered, which is pending at this time.   PAST MEDICAL HISTORY:  1. Hypertension.  2. COPD. 3. Chronic respiratory failure, and supposed to live on oxygen.  4. Hyperlipidemia.  5. Secondary polycythemia, followed as an outpatient with Dr. Orlie DakinFinnegan.  6. Heavy tobacco abuse.  7. GERD. 8. Alcohol abuse.   PAST SURGICAL HISTORY: According to the old records, cholecystectomy.  ALLERGIES: He has no known drug allergies.   PSYCHOSOCIAL HISTORY: Smokes heavily, unknown quantity. According to the old records, the patient is a heavy alcoholic.   FAMILY HISTORY: Hypertension runs in his family.   HOME MEDICATIONS:  1. Demerol 50 mg 2 times a day as needed. 2. Spiriva 18 mcg inhalation once daily. 3. Omeprazole 20 mg 2 times a day.  4. Naproxen 375 mg 2 times a day.  5. Hydrochlorothiazide/losartan 25/100 once daily.  6. Daliresp 500 mcg once daily. 7. Amiodarone 200 mg 2 times daily.   REVIEW OF SYSTEMS: Unobtainable as the patient is lethargic and with narcosis.   PHYSICAL EXAMINATION:  VITAL SIGNS: Temperature 99.3, pulse 115, respirations 26, blood pressure is 111/57, pulse oximetry 94%.  GENERAL APPEARANCE: The patient is under acute respiratory failure, using all accessory muscle and working hard to breathe.   HEENT: Normocephalic, atraumatic. Pupils are sluggishly reacting to light and accommodation. Nares are patent. Dry mucous membranes.  NECK: Supple. No JVD. No thyromegaly.  LUNGS: Diffuse wheezing is present bilaterally. Using all accessory muscles and abdominal muscles to breathe. Currently on BiPAP, getting intubated soon.  CARDIAC: S1 and S2, regular, tachycardic. No murmur.  GASTROINTESTINAL: Soft. Bowel sounds are positive in all 4 quadrants. Nondistended. NEUROLOGIC: The patient is lethargic, arousable, but could not obtain any history. Could not elicit cranial  nerves, motor and sensory.  EXTREMITIES: No edema. No cyanosis. No clubbing. Reflexes are 2+. Peripheral pulses are 2+. PSYCHIATRIC: Mood and affect could not be elicited as the patient is lethargic.  MUSCULOSKELETAL: No joint effusion, tenderness.  SKIN: Warm to touch. Decreased turgor. Bruises and lacerations are noticed on the extremities.   LABORATORY AND IMAGING STUDIES: Troponin less than 0.02 x2. WBC 13.0, hemoglobin 14.2, hematocrit 43.2, platelets 223. Initial VBG: pH 7.24, pCO2 65. Repeat ABG at 5:00 on BiPAP with FiO2 33%, pH is 7.14, pCO2 73, pO2 92, base excess -5.7, bicarbonate is 24.8. Glucose 80, BUN 11, creatinine 0.71, sodium 123, potassium 4.0, chloride 91, CO2 26, anion gap is 6, GFR greater than 60, serum osmolality 234. Serum alcohol level is ordered, which is pending. Flu test is pending as well. A 12-lead EKG has revealed sinus tachycardia at 119 beats per minute, normal PR interval, borderline left atrial enlargement, nonspecific ST-T wave changes.   ASSESSMENT AND PLAN: A 64 year old Caucasian male brought into the ER for acute respiratory distress associated with hypoxemia. Will be admitted with the following assessment and plan:   1. Acute respiratory failure from acute exacerbation of chronic obstructive pulmonary disease, CO2 narcosis and metabolic encephalopathy. Will be admitted to CCU. The patient is getting intubated for acute respiratory failure. Will get repeat ABG at 7:00 a.m. Will get a STAT chest x-ray following intubation. Will provide Solu-Medrol 60 mg IV q.6 hours, DuoNebs q.6 hours and albuterol q.4 hours as needed. The patient will be on empiric antibiotic, IV levofloxacin. Flu testing is pending. Will get repeat ABG at 7:00 a.m. Pulmonology consult is placed.  2. Hyponatremia, probably from dehydration with alcohol abuse. Differential diagnosis can be syndrome of inappropriate antidiuretic hormone. The patient was given IV fluids. Will get urine osmolality and  urine electrolytes. Nephrology consult is placed.  3. Hypertension. Blood pressure is stable at this time. The patient is n.p.o. Will hold off on the home medication.  4. Hyperlipidemia.  5. Gastroesophageal reflux disease. The patient will be on gastrointestinal prophylaxis with IV Protonix.  6. History of noncompliance.  7. History of alcohol abuse. Serum alcohol level is pending. Depending on the alcohol level, will put him on CIWA protocol.  8. I have tried to reach family members I could not reach any one of them. I left a voicemail to  one of the family members at [phone].   CODE STATUS: Will keep his code status as full code until his code status is determined.  TOTAL CRITICAL CARE TIME SPENT: 60 minutes.   ____________________________ Ramonita Lab, MD ag:lb D: 12/08/2013 05:51:59 ET T: 12/08/2013 06:22:35 ET JOB#: [id number]  cc: Ramonita Lab, MD, <Dictator> Ramonita Lab MD ELECTRONICALLY SIGNED 12/17/2013 7:16

## 2015-04-01 NOTE — Discharge Summary (Signed)
ADDENDUM  PATIENT NAME:  Shaun Mitchell, [name] MR#:  [id number] DATE OF BIRTH:  1951/04/10  DATE OF ADMISSION:  12/08/2013 DATE OF DISCHARGE:  12/23/2013  This is an addendum to an already dictated interim discharge summary by Dr. Milagros LollSrikar Sudini on 11th of January.   DISCHARGE DIAGNOSES:  1.  Acute on chronic respiratory failure due to chronic obstructive pulmonary disease exacerbation. The patient was intubated initially on admission and extubated on 7th of January.  2.  Hypertension.  3.  Hypo secondary to hypovolemia.  4.  Alcohol abuse in withdrawal. 5.  Tobacco abuse. 6.  Depression.   HOSPITAL COURSE: Please review interim discharge summary done on 11th of January by Dr. Milagros LollSrikar Sudini. Hospital course from 12th of January. The patient was feeling fine, but overall generalized weakness.  from his alcohol withdrawal and his chronic obstructive pulmonary disease was improving fine. He was waiting for placement to a rehab because of his weakness, which his brother suggested [place], [place], so there was some trouble getting placement over there. We waited for 3 to 4 days for this placement, but finally the patient requested to go home with home health aide services and these were the patient's wishes and we arranged for that,  DISCHARGE MEDICATIONS:   1.  Spiriva 18 mg inhalations capsule once a day.  2.  Simvastatin 20 mg once a day.  3.  Prednisone 10 mg oral start at 60 and taper x 5 mg daily until complete.  4.  Citalopram 20 mg once a day.  5.  Cardizem 120 mg oral once a day.  6.  Amlodipine 5 mg once a day.  7.  Hydrochlorothiazide 12.5 mg once a day.  8.  Potassium chloride 10 mEq once a day.  9.  Nicotine patch transdermal once a day.  10. Ensure 1 can 3 times a day.  11. Symbicort 1 puff inhalation 2 times a day.  12. Ranitidine 150 mg oral capsule 2 times a day.   DISCHARGE INSTRUCTIONS: Advised to have 2 L nasal cannula oxygen at home and low  sodium diet. Follow up within 1 to 2 weeks with routine primary care physician. Labs remained stable after 12th January.  TOTAL TIME SPENT ON THIS DISCHARGE: 40 minutes.  ____________________________ Hope PigeonVaibhavkumar G. Elisabeth PigeonVachhani, MD vgv:aw D: 12/27/2013 21:59:59 ET T: 12/28/2013 08:10:07 ET JOB#: [id number]  cc: Hope PigeonVaibhavkumar G. Elisabeth PigeonVachhani, MD, <Dictator> Altamese DillingVAIBHAVKUMAR Martavion Couper MD ELECTRONICALLY SIGNED 12/31/2013 18:43

## 2015-08-10 IMAGING — CT CT CHEST W/ CM
2 of 3 series · 16 of 31 positions shown, 18 images · IV contrast (APPLIED)
Comparison: none

REASON FOR EXAM: hypoxia, fever, recent fall and rib fractures as well
COMMENTS:

[Series 5: lung windows · axial · 0.75mm/px · z∈[-600,-366]mm · 8 of 105 slices shown, 10 images]
[im 14/105  mediastinal]
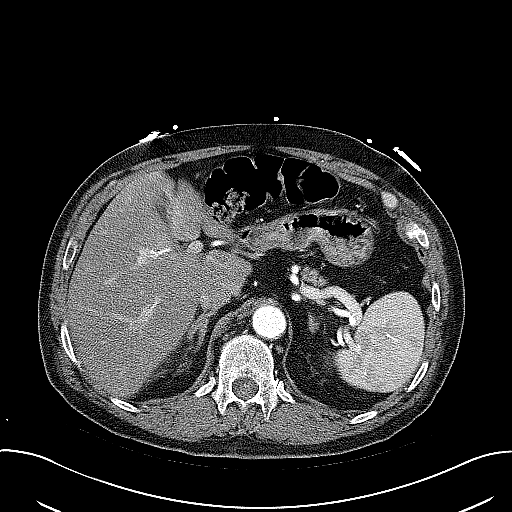
[im 14/105  lung]
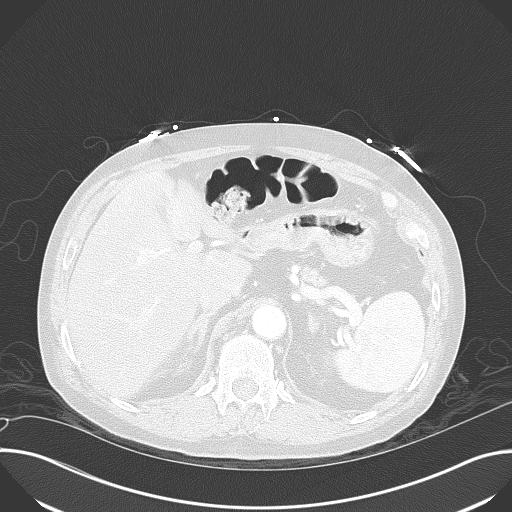
[im 27/105  lung]
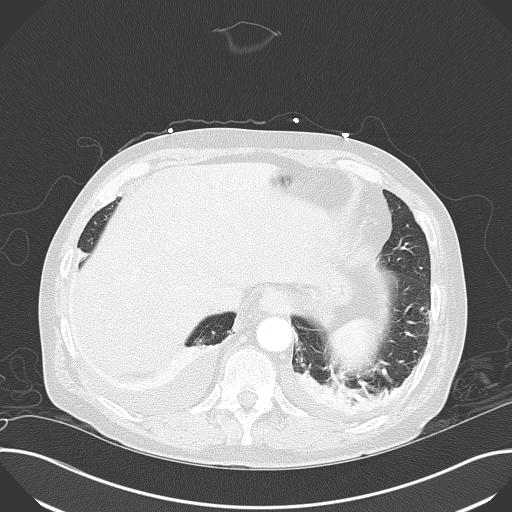
[im 40/105  lung]
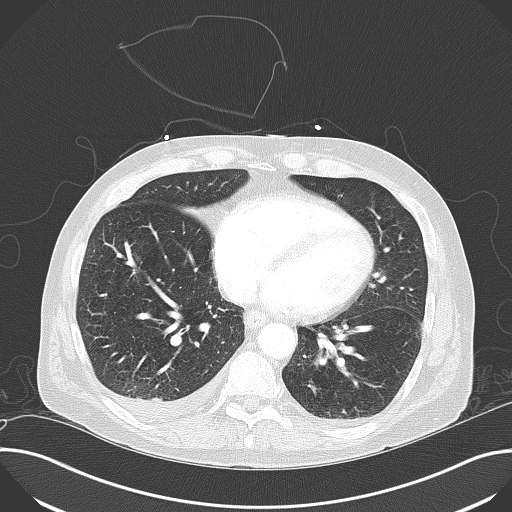
[im 49/105  lung]
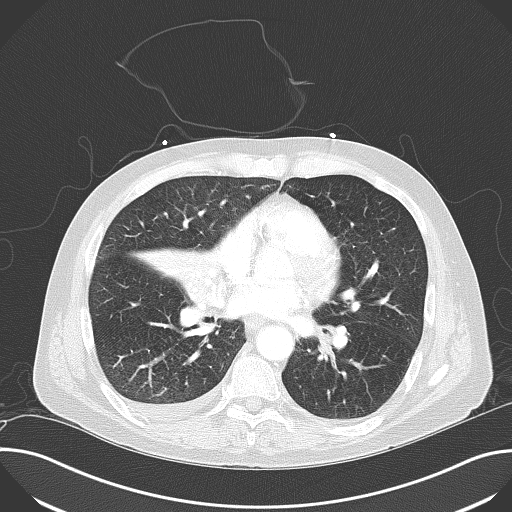
[im 53/105  mediastinal]
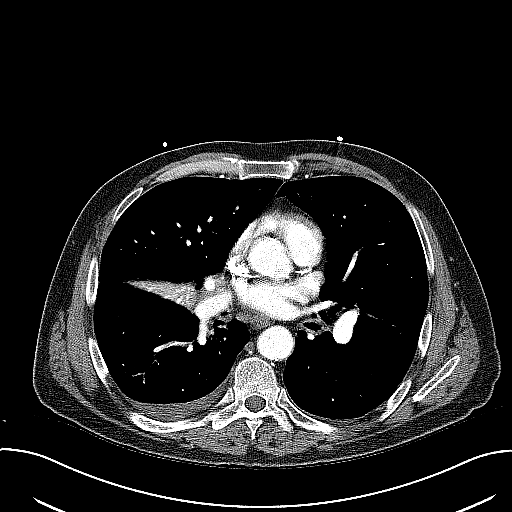
[im 53/105  lung]
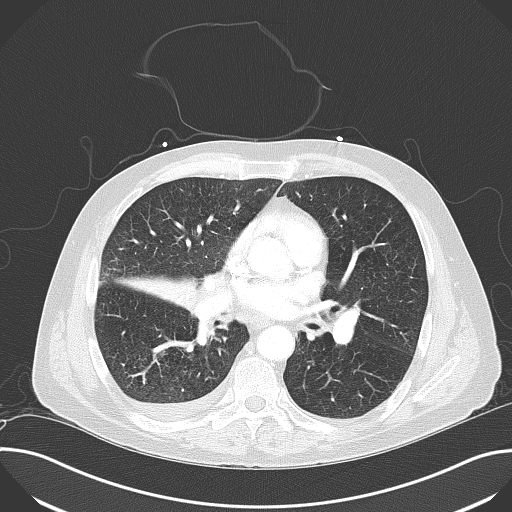
[im 66/105  lung]
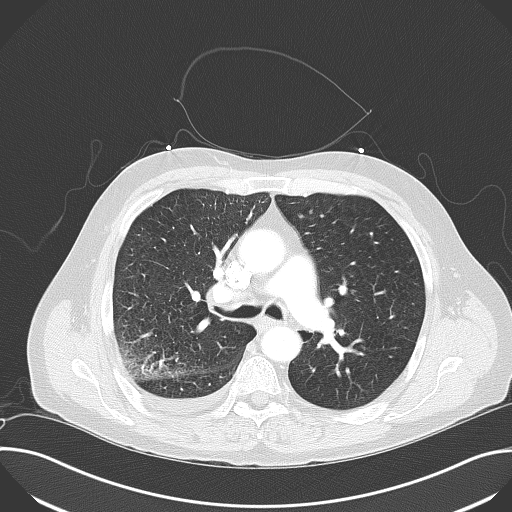
[im 79/105  lung]
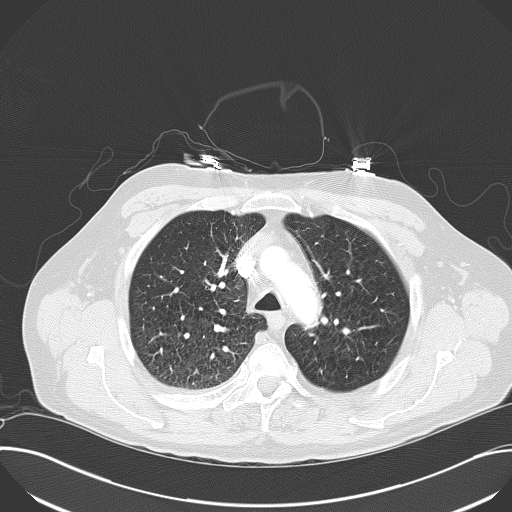
[im 92/105  lung]
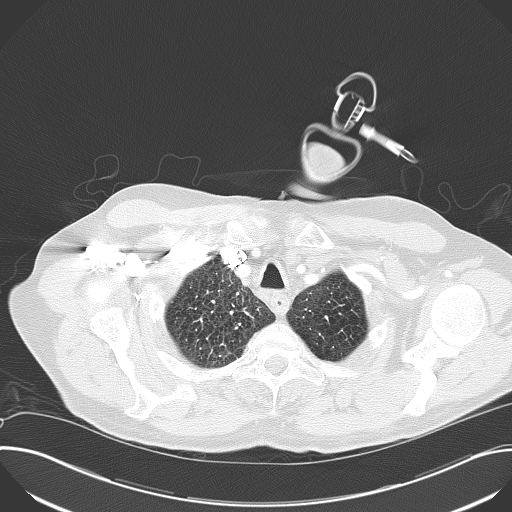

[Series 9: bone · axial · 0.75mm/px · z∈[-600,-366]mm · 8 of 105 slices shown]
[im 14/105  lung]
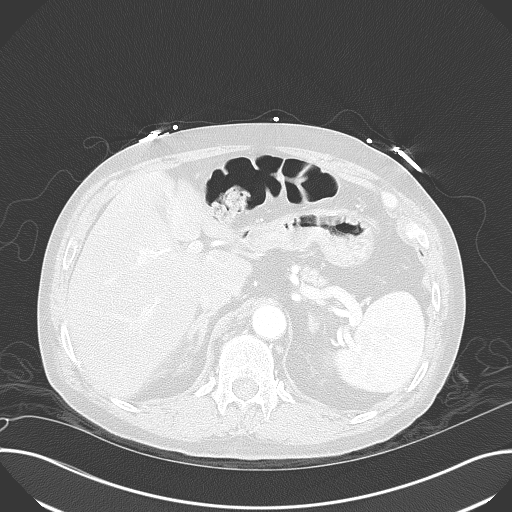
[im 27/105  lung]
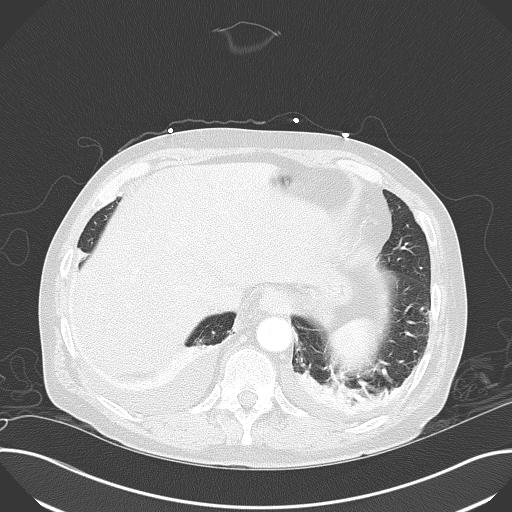
[im 40/105  lung]
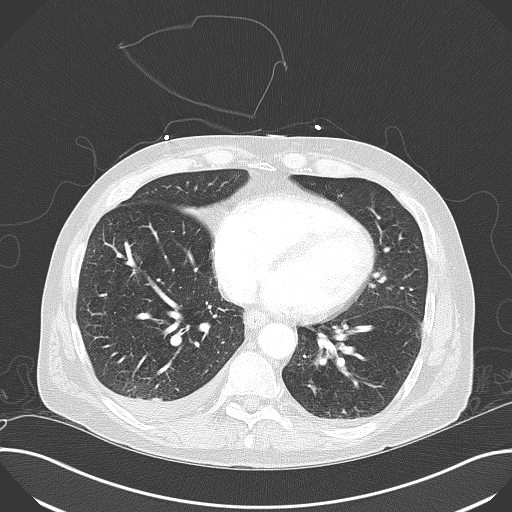
[im 49/105  lung]
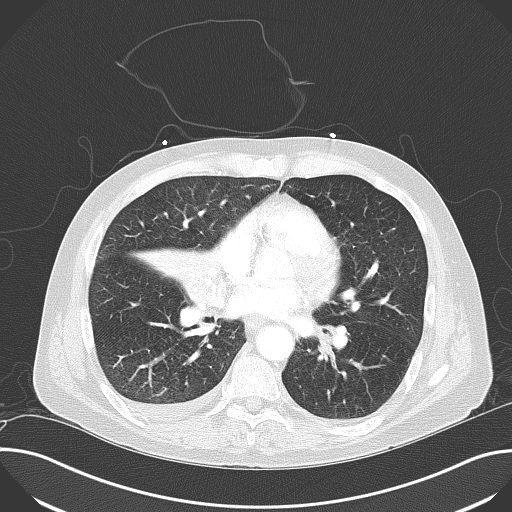
[im 53/105  lung]
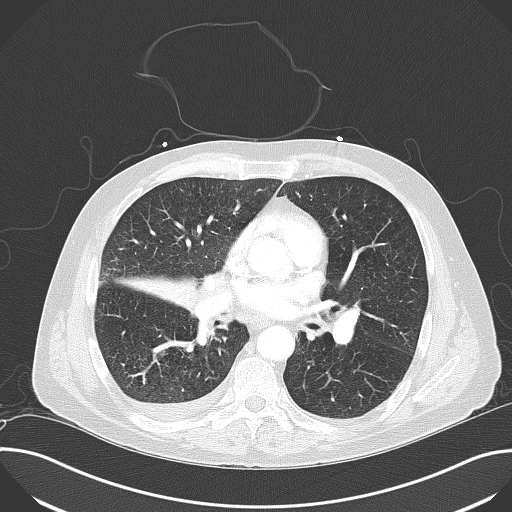
[im 66/105  lung]
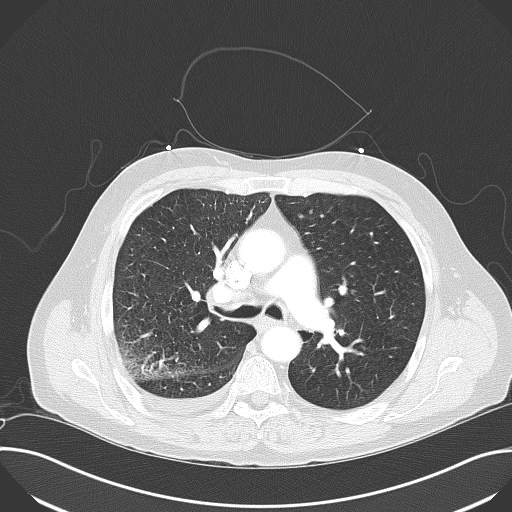
[im 79/105  lung]
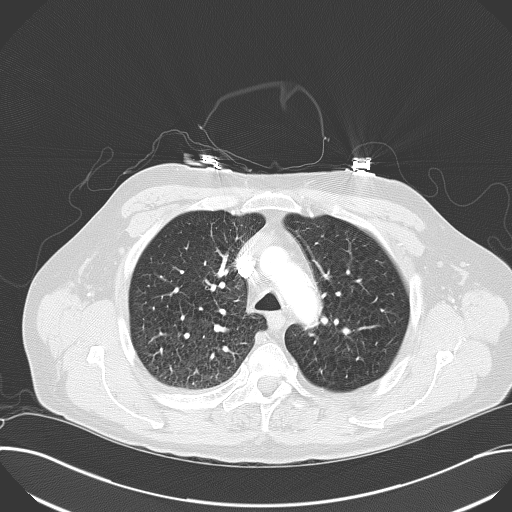
[im 92/105  lung]
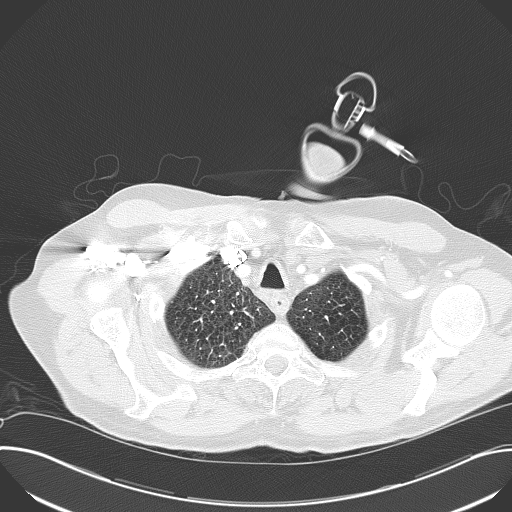

[16 of 31 positions shown; findings below may reference images not displayed]

PROCEDURE:     CT  - CT CHEST WITH CONTRAST  - September 02, 2013  [DATE]

RESULT:     Chest CT is performed with 100 mL of Usovue-NC8 iodinated
intravenous contrast with images reconstructed at 3.0 mm slice thickness in
the axial plane. Comparison is made to a previous exam of 08/29/2013.

Lung window images show diffuse emphysematous lung disease. There is
atelectasis in the right middle lobe along the major fissure. This was seen
previously and is unchanged. There appears to be narrowing of the airways
between images 52 and 55. Bronchoscopic correlation may be beneficial. There
are small bilateral pleural effusions slightly larger on the right. There is
compressive atelectasis in both lower lobes. There is no mediastinal
adenopathy. There is a mildly prominent subcarinal lymph node showing an
anterior to posterior dimension of 10 mm. There is no evidence of pulmonary
contusion or pneumothorax. No endobronchial lesion is seen otherwise. No
focal masses appreciated. The included upper abdominal structures appear
unremarkable.
IMPRESSION: 1. There is some atelectasis in the right lung as described with narrowing
of some of the airways in the right hilum. Bronchoscopic correlation may be
beneficial. No thoracic aortic aneurysm or dissection. No pulmonary arterial
filling defect is demonstrated. Small bilateral effusions with some minimal
lower lobe atelectasis are present. No gross bony abnormalities appreciated.

[REDACTED]

## 2015-11-15 IMAGING — CR DG CHEST 1V PORT
1 series · 1 of 1 positions shown · non-contrast
Comparison: 10/21/2013

CLINICAL DATA: Shortness of breath

EXAM:
PORTABLE CHEST - 1 VIEW

[ap]
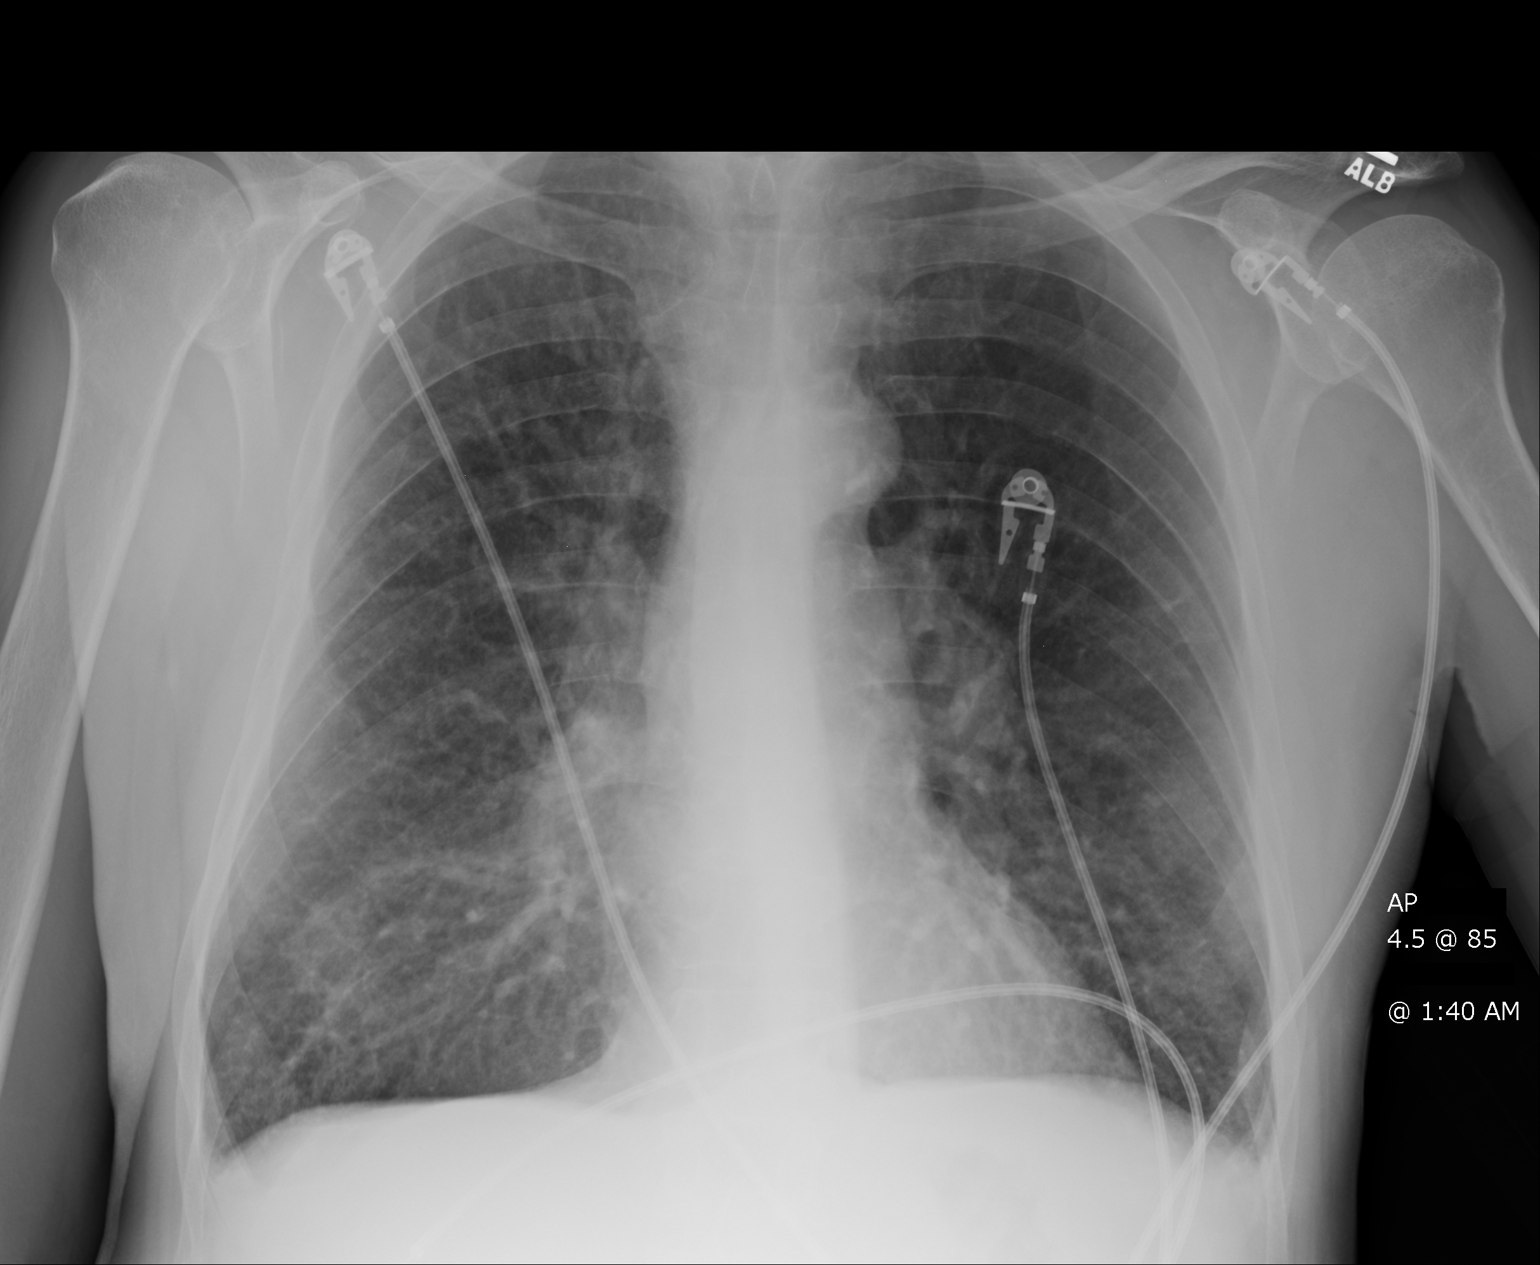

[1 of 1 positions shown; findings below may reference images not displayed]

FINDINGS: Normal heart size and pulmonary vascularity. Emphysematous changes
and scattered fibrosis in the lungs. No focal airspace disease or
consolidation. No blunting of costophrenic angles. No pneumothorax.
No significant change since previous study.
IMPRESSION: No active disease.

## 2015-11-16 IMAGING — CR DG CHEST 1V PORT
1 series · 1 of 1 positions shown · non-contrast
Comparison: 12/08/2013

CLINICAL DATA: Intubation.

EXAM:
PORTABLE CHEST - 1 VIEW

[ap]
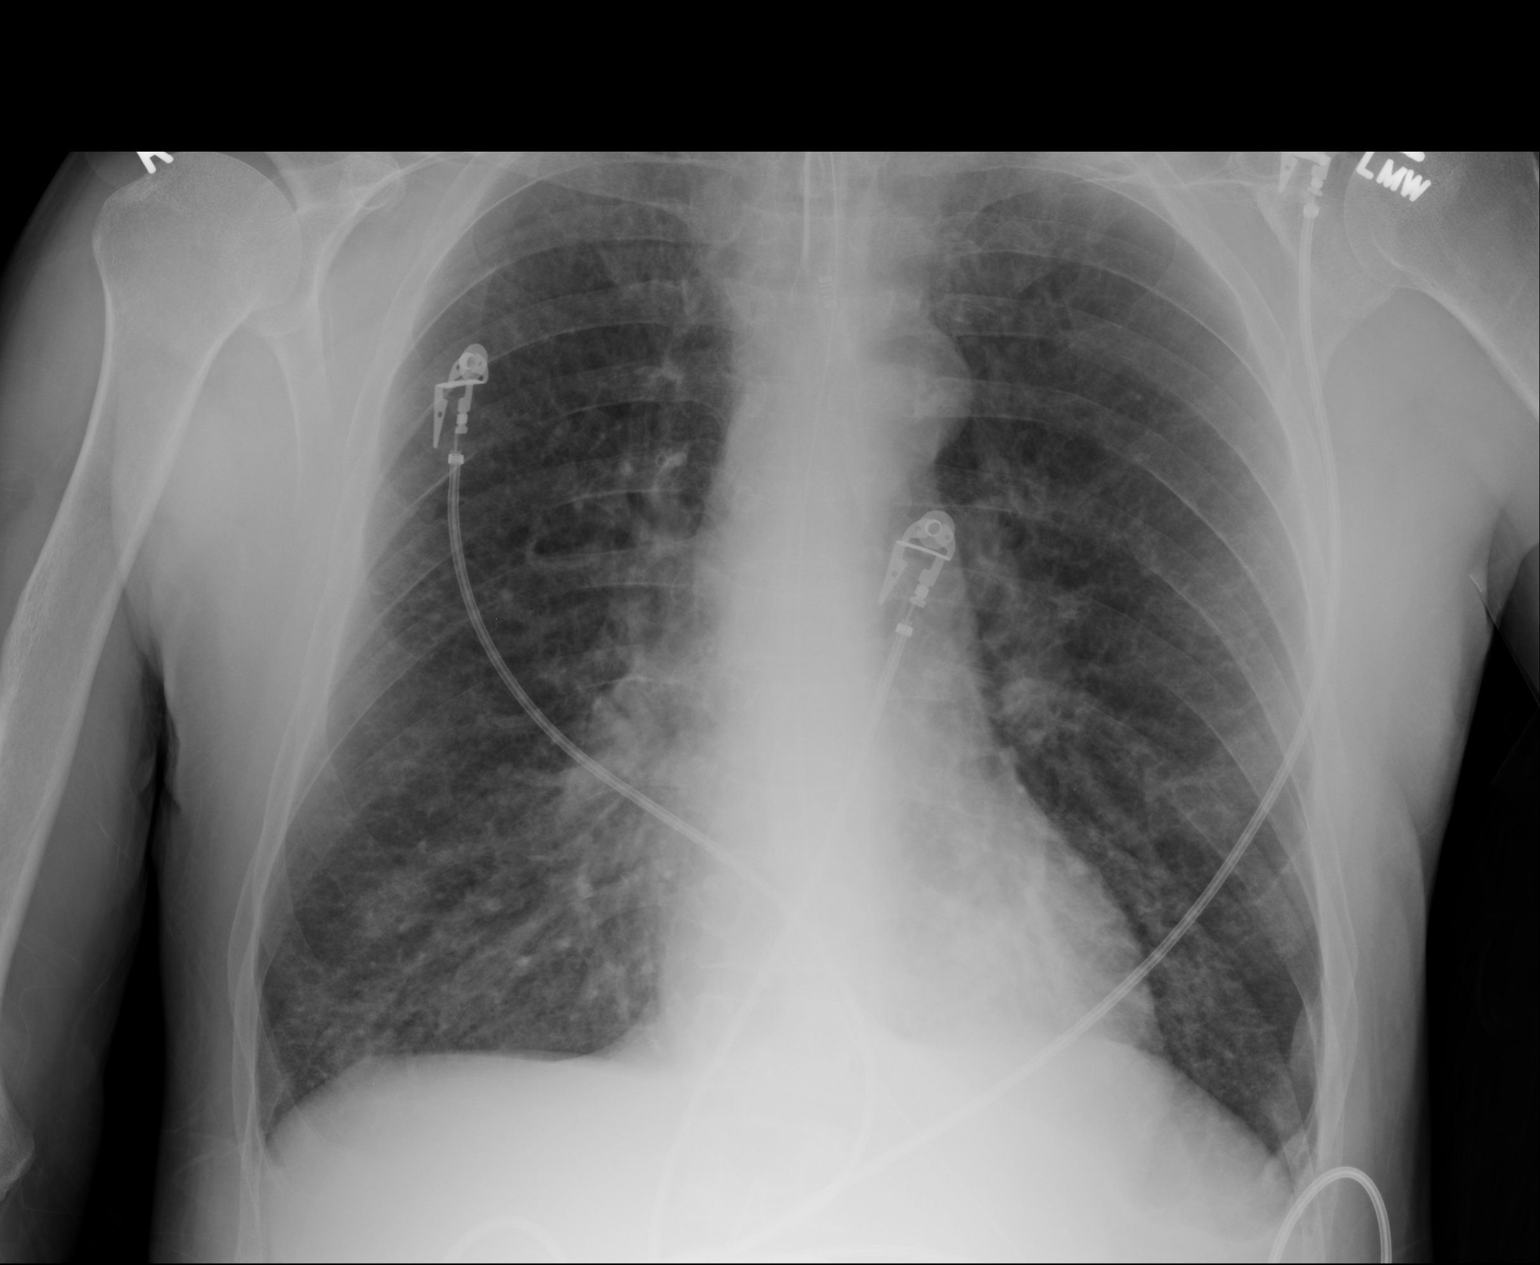

[1 of 1 positions shown; findings below may reference images not displayed]

FINDINGS: Endotracheal tube has tip approximately 6 cm above the carinal.
Nasogastric tube courses into the region of the stomach and off the
inferior portion of the film. The lungs are hyperexpanded with
minimal prominence of the perihilar markings suggesting a mild
degree of vascular congestion. No evidence of effusion.
Cardiomediastinal silhouette and remainder of the exam is unchanged.
IMPRESSION: Mild prominence of the perihilar markings suggesting a mild degree
of vascular congestion.

Tubes and lines as described.

## 2015-11-19 IMAGING — CR DG CHEST 1V PORT
1 series · 1 of 1 positions shown · non-contrast
Comparison: 12/10/2013.

CLINICAL DATA: Ventilator patient.

EXAM:
PORTABLE CHEST - 1 VIEW

[ap]
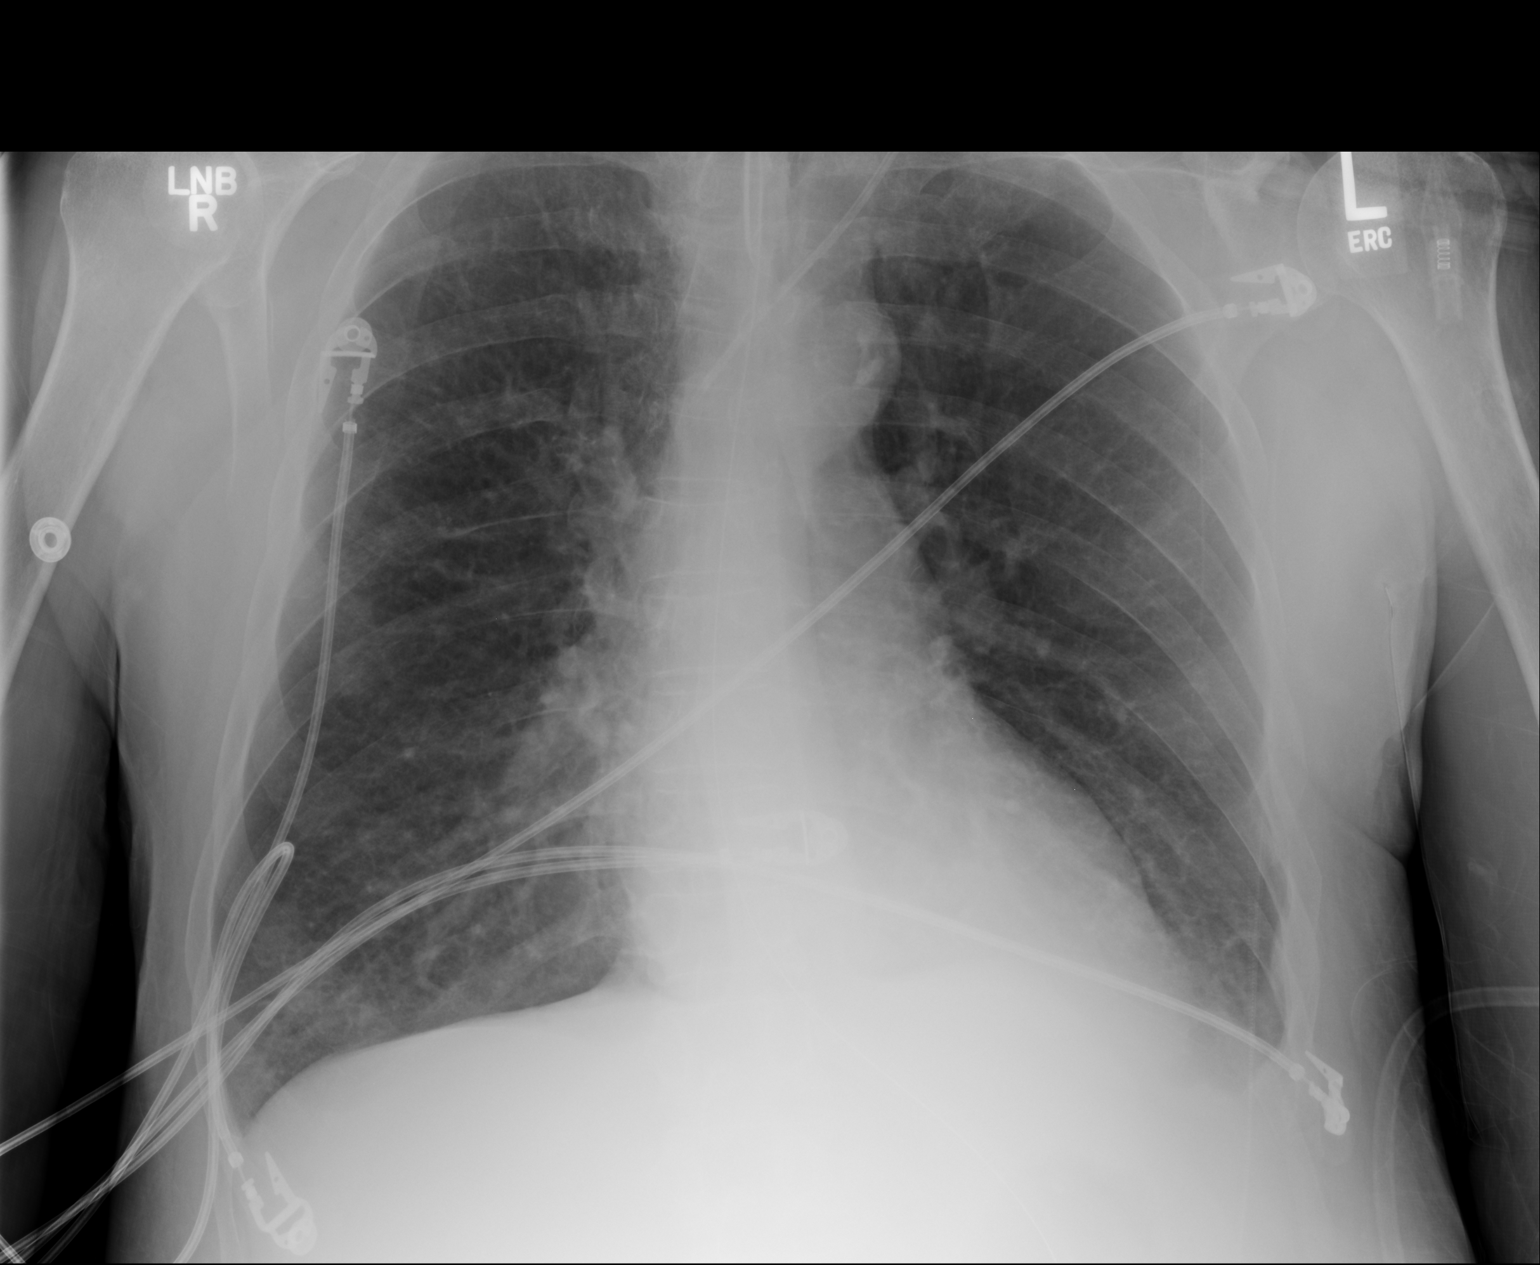

[1 of 1 positions shown; findings below may reference images not displayed]

FINDINGS: 3886 hr. The endotracheal tube and left IJ central venous catheter
are unchanged. A nasogastric tube has been placed, projecting below
the diaphragm. The heart size and mediastinal contours are stable.
The lungs are hyperinflated with stable mild left lower lobe
atelectasis. There is no pneumothorax or significant pleural
effusion. Old fracture of the distal right clavicle is noted.
IMPRESSION: Interval nasogastric tube replacement. The additional support system
appears unchanged. No new findings identified.
